# Patient Record
Sex: Female | Born: 1955 | Race: White | Hispanic: No | Smoking: Current every day smoker
Health system: Southern US, Community
[De-identification: ages and names within clinical notes are randomized; demographics above are authoritative.]

## PROBLEM LIST (undated history)

## (undated) DIAGNOSIS — J449 Chronic obstructive pulmonary disease, unspecified: Secondary | ICD-10-CM

## (undated) DIAGNOSIS — I10 Essential (primary) hypertension: Secondary | ICD-10-CM

## (undated) DIAGNOSIS — R87619 Unspecified abnormal cytological findings in specimens from cervix uteri: Secondary | ICD-10-CM

## (undated) DIAGNOSIS — R87629 Unspecified abnormal cytological findings in specimens from vagina: Secondary | ICD-10-CM

## (undated) DIAGNOSIS — N816 Rectocele: Secondary | ICD-10-CM

## (undated) HISTORY — DX: Chronic obstructive pulmonary disease, unspecified: J44.9

## (undated) HISTORY — PX: TUBAL LIGATION: SHX77

## (undated) HISTORY — PX: CHOLECYSTECTOMY: SHX55

## (undated) HISTORY — PX: APPENDECTOMY: SHX54

## (undated) HISTORY — DX: Unspecified abnormal cytological findings in specimens from vagina: R87.629

## (undated) HISTORY — DX: Rectocele: N81.6

## (undated) HISTORY — DX: Essential (primary) hypertension: I10

## (undated) HISTORY — DX: Unspecified abnormal cytological findings in specimens from cervix uteri: R87.619

---

## 2001-11-19 ENCOUNTER — Encounter: Payer: Self-pay | Admitting: Internal Medicine

## 2001-11-19 ENCOUNTER — Ambulatory Visit (HOSPITAL_COMMUNITY): Admission: RE | Admit: 2001-11-19 | Discharge: 2001-11-19 | Payer: Self-pay | Admitting: Internal Medicine

## 2003-10-21 ENCOUNTER — Inpatient Hospital Stay (HOSPITAL_COMMUNITY): Admission: AD | Admit: 2003-10-21 | Discharge: 2003-10-23 | Payer: Self-pay | Admitting: General Surgery

## 2005-09-08 ENCOUNTER — Ambulatory Visit (HOSPITAL_COMMUNITY): Admission: RE | Admit: 2005-09-08 | Discharge: 2005-09-08 | Payer: Self-pay | Admitting: Family Medicine

## 2010-07-24 ENCOUNTER — Encounter: Payer: Self-pay | Admitting: Family Medicine

## 2015-12-24 ENCOUNTER — Other Ambulatory Visit (HOSPITAL_COMMUNITY): Payer: Self-pay | Admitting: Internal Medicine

## 2015-12-24 DIAGNOSIS — Z1231 Encounter for screening mammogram for malignant neoplasm of breast: Secondary | ICD-10-CM

## 2015-12-30 ENCOUNTER — Ambulatory Visit (HOSPITAL_COMMUNITY): Payer: Self-pay

## 2016-01-06 ENCOUNTER — Ambulatory Visit (HOSPITAL_COMMUNITY)
Admission: RE | Admit: 2016-01-06 | Discharge: 2016-01-06 | Disposition: A | Discharge status: Home / Self Care | Payer: BLUE CROSS/BLUE SHIELD | Source: Ambulatory Visit | Attending: Internal Medicine | Admitting: Internal Medicine

## 2016-01-06 ENCOUNTER — Other Ambulatory Visit (HOSPITAL_COMMUNITY)
Admission: RE | Admit: 2016-01-06 | Discharge: 2016-01-06 | Disposition: A | Discharge status: Home / Self Care | Payer: BLUE CROSS/BLUE SHIELD | Source: Ambulatory Visit | Attending: Adult Health | Admitting: Adult Health

## 2016-01-06 ENCOUNTER — Encounter: Payer: Self-pay | Admitting: Adult Health

## 2016-01-06 ENCOUNTER — Ambulatory Visit (INDEPENDENT_AMBULATORY_CARE_PROVIDER_SITE_OTHER): Payer: BLUE CROSS/BLUE SHIELD | Admitting: Adult Health

## 2016-01-06 VITALS — BP 122/70 | HR 100 | Ht 67.0 in | Wt 155.0 lb

## 2016-01-06 DIAGNOSIS — N816 Rectocele: Secondary | ICD-10-CM | POA: Diagnosis not present

## 2016-01-06 DIAGNOSIS — Z1151 Encounter for screening for human papillomavirus (HPV): Secondary | ICD-10-CM | POA: Insufficient documentation

## 2016-01-06 DIAGNOSIS — Z1211 Encounter for screening for malignant neoplasm of colon: Secondary | ICD-10-CM

## 2016-01-06 DIAGNOSIS — Z01411 Encounter for gynecological examination (general) (routine) with abnormal findings: Secondary | ICD-10-CM | POA: Insufficient documentation

## 2016-01-06 DIAGNOSIS — Z1231 Encounter for screening mammogram for malignant neoplasm of breast: Secondary | ICD-10-CM | POA: Diagnosis not present

## 2016-01-06 DIAGNOSIS — Z01419 Encounter for gynecological examination (general) (routine) without abnormal findings: Secondary | ICD-10-CM

## 2016-01-06 HISTORY — DX: Rectocele: N81.6

## 2016-01-06 LAB — HEMOCCULT GUIAC POC 1CARD (OFFICE): FECAL OCCULT BLD: NEGATIVE

## 2016-01-06 NOTE — Patient Instructions (Signed)
Physical in 1 year, pap in 3 if normal Mammogram yearly Colonoscopy advised Labs at work

## 2016-01-06 NOTE — Progress Notes (Signed)
Patient ID: TASA SADD, female   DOB: 01-Jun-1956, 60 y.o.   MRN: [id number] History of Present Illness: Diana Hubbard is a 60 year old white female, married in for a well woman gyn exam and pap, she is new to this practice.She works at [employer] PCP [facility]   Current Medications, Allergies, Past Medical History, Past Surgical History, Family History and Social History were reviewed in [employer] record.     Review of Systems: Patient denies any headaches, hearing loss, fatigue, blurred vision, shortness of breath, chest pain, abdominal pain, problems with bowel movements, urination, or intercourse. No mood swings.She has some aches and pains in hips and low back at times.She denies any hot flashes.    Physical Exam:BP 122/70 mmHg  Pulse 100  Ht 5\' 7"  (1.702 m)  Wt 155 lb (70.308 kg)  BMI 24.27 kg/m2 General:  Well developed, well nourished, no acute distress Skin:  Warm and dry, has increased number AKs and moles,none of concern Neck:  Midline trachea, normal thyroid, good ROM, no lymphadenopathy,no carotid bruits heard Lungs; Clear to auscultation bilaterally Breast:  No dominant palpable mass, retraction, or nipple discharge Cardiovascular: Regular rate and rhythm Abdomen:  Soft, non tender, no hepatosplenomegaly Pelvic:  External genitalia is normal in appearance, has some black heads left groin.  The vagina has decreased color, moisture and rugae. Urethra has no lesions or masses. The cervix is smooth, pap with HPV performed.  Uterus is felt to be normal size, shape, and contour.  No adnexal masses or tenderness noted.Bladder is non tender, no masses felt. Rectal: Good sphincter tone, no polyps, or hemorrhoids felt.  Hemoccult negative.+rectocele Extremities/musculoskeletal:  No swelling or varicosities noted, no clubbing or cyanosis Psych:  No mood changes, alert and cooperative,seems happy   Impression: Well woman gyn exam and pap Rectocele      Plan: Physical in 1 year, pap in 3 if normal Mammogram yearly,she had this [facility] at [facility]  Labs at work Colonoscopy advised

## 2016-01-10 LAB — CYTOLOGY - PAP

## 2016-01-12 ENCOUNTER — Telehealth: Payer: Self-pay | Admitting: Adult Health

## 2016-01-12 ENCOUNTER — Encounter: Payer: Self-pay | Admitting: Adult Health

## 2016-01-12 DIAGNOSIS — R87619 Unspecified abnormal cytological findings in specimens from cervix uteri: Secondary | ICD-10-CM

## 2016-01-12 DIAGNOSIS — R87612 Low grade squamous intraepithelial lesion on cytologic smear of cervix (LGSIL): Secondary | ICD-10-CM

## 2016-01-12 HISTORY — DX: Unspecified abnormal cytological findings in specimens from cervix uteri: R87.619

## 2016-01-12 NOTE — Telephone Encounter (Signed)
Pt aware pap abnormal and needs colpo, appt made

## 2016-01-12 NOTE — Telephone Encounter (Signed)
Left message to call about pap,if she calls needs colpo 

## 2016-01-20 ENCOUNTER — Encounter: Payer: BLUE CROSS/BLUE SHIELD | Admitting: Obstetrics & Gynecology

## 2016-02-02 ENCOUNTER — Encounter: Payer: Self-pay | Admitting: Obstetrics & Gynecology

## 2016-02-02 ENCOUNTER — Ambulatory Visit (INDEPENDENT_AMBULATORY_CARE_PROVIDER_SITE_OTHER): Payer: BLUE CROSS/BLUE SHIELD | Admitting: Obstetrics & Gynecology

## 2016-02-02 VITALS — BP 120/80 | HR 72 | Ht 67.2 in | Wt 152.0 lb

## 2016-02-02 DIAGNOSIS — N871 Moderate cervical dysplasia: Secondary | ICD-10-CM | POA: Diagnosis not present

## 2016-02-02 DIAGNOSIS — R87612 Low grade squamous intraepithelial lesion on cytologic smear of cervix (LGSIL): Secondary | ICD-10-CM | POA: Diagnosis not present

## 2016-02-02 DIAGNOSIS — IMO0002 Reserved for concepts with insufficient information to code with codable children: Secondary | ICD-10-CM

## 2016-02-02 NOTE — Progress Notes (Signed)
Colposcopy Procedure Note:  Colposcopy Procedure Note  Indications: Pap smear 1 months ago showed: low-grade squamous intraepithelial neoplasia (LGSIL - encompassing HPV,mild dysplasia,CIN I). The prior pap showed ASCUS with NEGATIVE high risk HPV.  Prior cervical/vaginal disease: normal exam without visible pathology. Prior cervical treatment: no treatment.  Smoker:  Yes.   New sexual partner:  No.  : time frame:    History of abnormal Pap: yes  Procedure Details  The risks and benefits of the procedure and Written informed consent obtained.  Speculum placed in vagina and excellent visualization of cervix achieved, cervix swabbed x 3 with acetic acid solution.  Findings: Cervix: no visible lesions, no mosaicism, no punctation and no abnormal vasculature; no biopsies taken. Vaginal inspection: normal without visible lesions. Vulvar colposcopy: vulvar colposcopy not performed.  Specimens: none  Complications: none.  Plan: Follow up Pap 1 year, needs 3 years of normal Paps to return to baseline screening

## 2016-02-08 ENCOUNTER — Telehealth: Payer: Self-pay | Admitting: Family Medicine

## 2016-02-08 NOTE — Telephone Encounter (Signed)
LM for patient to call back and set up a new pt apt with Dr. Meda Coffee

## 2016-03-21 DIAGNOSIS — I10 Essential (primary) hypertension: Secondary | ICD-10-CM | POA: Insufficient documentation

## 2016-03-21 DIAGNOSIS — Z720 Tobacco use: Secondary | ICD-10-CM | POA: Insufficient documentation

## 2016-03-22 ENCOUNTER — Ambulatory Visit (INDEPENDENT_AMBULATORY_CARE_PROVIDER_SITE_OTHER): Payer: BLUE CROSS/BLUE SHIELD | Admitting: Family Medicine

## 2016-03-22 ENCOUNTER — Encounter: Payer: Self-pay | Admitting: Family Medicine

## 2016-03-22 VITALS — BP 134/72 | HR 92 | Resp 18 | Ht 68.0 in | Wt 152.1 lb

## 2016-03-22 DIAGNOSIS — Z720 Tobacco use: Secondary | ICD-10-CM

## 2016-03-22 DIAGNOSIS — Z7189 Other specified counseling: Secondary | ICD-10-CM

## 2016-03-22 DIAGNOSIS — J432 Centrilobular emphysema: Secondary | ICD-10-CM | POA: Insufficient documentation

## 2016-03-22 DIAGNOSIS — J449 Chronic obstructive pulmonary disease, unspecified: Secondary | ICD-10-CM

## 2016-03-22 DIAGNOSIS — Z7689 Persons encountering health services in other specified circumstances: Secondary | ICD-10-CM

## 2016-03-22 DIAGNOSIS — I10 Essential (primary) hypertension: Secondary | ICD-10-CM | POA: Diagnosis not present

## 2016-03-22 DIAGNOSIS — Z1211 Encounter for screening for malignant neoplasm of colon: Secondary | ICD-10-CM

## 2016-03-22 DIAGNOSIS — R0989 Other specified symptoms and signs involving the circulatory and respiratory systems: Secondary | ICD-10-CM | POA: Diagnosis not present

## 2016-03-22 MED ORDER — LOSARTAN POTASSIUM-HCTZ 50-12.5 MG PO TABS: 1.0000 | ORAL_TABLET | Freq: Every day | ORAL | 3 refills | Status: DC

## 2016-03-22 NOTE — Patient Instructions (Addendum)
Need records and labs from prior providers  Walk every day that you are able  Stop the lisinopril and HCTZ Take losartan and hydrochlorothiazide once a day in its place Let me know if you have any side effect or problem  Come back in a month  I have ordered an ultrasound of your neck to check on the bruit

## 2016-03-22 NOTE — Progress Notes (Signed)
Chief Complaint  Patient presents with  . Establish Care    seen once w/ Dr. Maudie Mercury and was previously being seen at [facility] at work    60 year old woman who is new to establish. She has previously cared for by a number of providers. Her most recent medical care has been [facility]. These records are requested. No old records are available today. Only records involve her recent GYN visit, abnormal Pap smear and colposcopy. She has had a recent mammogram. Health maintenance standpoint she states her shots are up-to-date, however she has not had a shingles shot, she has not had a Prevnar, and she can't remember her last tetanus shot. She thinks this information will be in her old records. Does get yearly flu shots. She has never had a colonoscopy. She has refused today. We discussed the importance of colon cancer screening. She does agree today to a cologuard. She agrees that if her cologuard is positive then she will have a colonoscopy. She states that she was identified as having hypertension about 8 months ago. She has been on hydrochlorothiazide and lisinopril. She feels like her blood pressure has been under good control. Unfortunately. The lisinopril is causing a cough. I'm going to discontinue her ACE inhibitor and put her on an ARB instead. She will come back in a month for [place] to check her blood pressure again. She's been smoking since she was a teenager. She smokes a half to three quarters of a pack a day. She has not had all interested in smoking cessation, or smoking counseling. She hasn't really been diagnosed with COPD. I have discussed the multiple health risks associated with cigarette smoking including, but not limited to, cardiovascular disease, lung disease and cancer.  I have strongly recommended that smoking be stopped.  I have reviewed the various methods of quitting including cold [place], classes, nicotine replacements and prescription  medications.  I have offered assistance in this difficult process.  The patient is not interested in assistance at this time. The patient sees a Designer, jewellery at the health department. The nurse practitioner told her she has a carotid bruit. She is interested in having this worked up. She has a family history of vascular disease. I told her with hypertension, a family history of vascular disease, and cigarette smoking that this may be an indication of hardening of the arteries. This would entail both a cardiac workup as well as an ultrasound of her neck. He states she has never been told she has any diabetes or high cholesterol. The patient's tries to eat a well-balanced diet. She does state "I like my red meat". She does not get regular exercise.      Patient Active Problem List   Diagnosis Date Noted  . COPD (chronic obstructive pulmonary disease) ([place]) 03/22/2016  . Essential hypertension 03/21/2016  . Tobacco abuse 03/21/2016  . Abnormal Pap smear of cervix 01/12/2016  . Rectocele 01/06/2016    Outpatient Encounter Prescriptions as of 03/22/2016  Medication Sig  . hydrochlorothiazide (HYDRODIURIL) 25 MG tablet Take 25 mg by mouth daily.  . [DISCONTINUED] lisinopril (PRINIVIL,ZESTRIL) 20 MG tablet Take 20 mg by mouth daily.  Marland Kitchen losartan-hydrochlorothiazide (HYZAAR) 50-12.5 MG tablet Take 1 tablet by mouth daily.  . [DISCONTINUED] penicillin v potassium (VEETID) 500 MG tablet Take 500 mg by mouth 4 (four) times daily.   No facility-administered encounter medications on file as of 03/22/2016.     Past Medical History:  Diagnosis Date  . Abnormal Pap smear of cervix 01/12/2016   LSIL will get colpo  . COPD (chronic obstructive pulmonary disease) ([place])   . Hypertension   . Rectocele 01/06/2016  . Vaginal Pap smear, abnormal     Past Surgical History:  Procedure Laterality Date  . APPENDECTOMY    . CHOLECYSTECTOMY    . TUBAL LIGATION      Social History   Social History  .  Marital status: Married    Spouse name: Elta Guadeloupe  . Number of children: 2  . 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 Smoking status: Current Every Day Smoker    Packs/day: 0.75    Years: 40.00    Types: Cigarettes  . Smokeless tobacco: Never Used  . Alcohol use Yes     Comment: social  . Drug use: No  . Sexual activity: Yes    Birth control/ protection: Post-menopausal, Surgical     Comment: tubal   Other Topics Concern  . Not on file   Social History Narrative   Married.  Lives with Husband   Limited caffeine up to 2 -3 cups a day   No reg exercise, walk at work    Family History  Problem Relation Age of Onset  . Diabetes Mother   . Hypertension Mother   . Hyperlipidemia Mother   . Other Father     abd aneursym  . Hyperlipidemia Father   . Hypertension Father   . Hemophilia Father   . Cancer Maternal Grandmother     uterine    Review of Systems  Constitutional: Negative for chills, fever and weight loss.  HENT: Negative for congestion and hearing loss.   Eyes: Negative for blurred vision and pain.  Respiratory: Positive for cough. Negative for sputum production and shortness of breath.   Cardiovascular: Negative for chest pain and leg swelling.  Gastrointestinal: Negative for abdominal pain, constipation, diarrhea and heartburn.  Genitourinary: Negative for dysuria and frequency.  Musculoskeletal: Negative for falls, joint pain and myalgias.  Neurological: Negative for dizziness, seizures and headaches.  Psychiatric/Behavioral: Negative for depression. The patient is not nervous/anxious and does not have insomnia.    BP 134/72   Pulse 92   Resp 18   Ht 5\' 8"  (1.727 m)   Wt 152 lb 1.3 oz (69 kg)   SpO2 98%   BMI 23.12 kg/m   Physical Exam  Constitutional: She is oriented to person, place, and time. She appears well-developed and well-nourished.  HENT:  Head: Normocephalic and  atraumatic.  Right Ear: External ear normal.  Left Ear: External ear normal.  Mouth/Throat: Oropharynx is clear and moist.  Eyes: Conjunctivae are normal. Pupils are equal, round, and reactive to light.  Neck: Normal range of motion. Neck supple. No thyromegaly present.  Cardiovascular: Normal rate, regular rhythm, normal heart sounds and intact distal pulses.   No murmur heard. Bilateral short carotid bruit  Pulmonary/Chest: Effort normal and breath sounds normal. No respiratory distress.  Abdominal: Soft. Bowel sounds are normal.  Musculoskeletal: Normal range of motion. She exhibits no edema.  Lymphadenopathy:    She has no cervical adenopathy.  Neurological: She is alert and oriented to person, place, and time. She has normal reflexes.  Gait normal  Skin: Skin is warm and dry.  Psychiatric: She has a normal mood and affect. Her behavior is normal. Thought content normal.  Nursing  note and vitals reviewed.  ASSESSMENT/PLAN:   1. Essential hypertension. Stop lisinopril   stop hydrochlorothiazide Take losartan/hydrochlorothiazide combination pill once a day. Follow blood pressure. Goal blood pressure of under 140/90 is discussed  2. Tobacco abuse  discussed  3. Chronic obstructive pulmonary disease, unspecified COPD type ([place]) Patient denies symptoms  4. Bilateral carotid bruits - US Carotid Bilateral; Future  5. Encounter to establish care with new doctor   6. Screen for colon cancer  - Cologuard   Patient Instructions  Need records and labs from prior providers  Walk every day that you are able  Stop the lisinopril and HCTZ Take losartan and hydrochlorothiazide once a day in its place Let me know if you have any side effect or problem  Come back in a month  I have ordered an ultrasound of your neck to check on the bruit   Discuss my recommendation that she get a shingles shot and Prevnar shot. She states she can get these through the health Department.    60 minutes is spent with this patient. More than 50% in counseling regarding the importance of tobacco cessation, and the consequences of continuing to smoke especially with physical exam findings suggestive of atherosclerotic cerebrovascular disease. We discussed preventative medicine, colonoscopy, cologuard testing. Discussed healthy lifestyle, diet, and exercise recommendations.  Raylene Everts, MD

## 2016-04-17 ENCOUNTER — Other Ambulatory Visit: Payer: Self-pay | Admitting: Family Medicine

## 2016-04-20 ENCOUNTER — Encounter: Payer: Self-pay | Admitting: Family Medicine

## 2016-04-20 ENCOUNTER — Ambulatory Visit (INDEPENDENT_AMBULATORY_CARE_PROVIDER_SITE_OTHER): Payer: BLUE CROSS/BLUE SHIELD | Admitting: Family Medicine

## 2016-04-20 VITALS — BP 124/78 | HR 88 | Temp 98.6°F | Resp 18 | Ht 68.0 in | Wt 152.1 lb

## 2016-04-20 DIAGNOSIS — I10 Essential (primary) hypertension: Secondary | ICD-10-CM

## 2016-04-20 DIAGNOSIS — Z720 Tobacco use: Secondary | ICD-10-CM | POA: Diagnosis not present

## 2016-04-20 DIAGNOSIS — J441 Chronic obstructive pulmonary disease with (acute) exacerbation: Secondary | ICD-10-CM | POA: Diagnosis not present

## 2016-04-20 NOTE — Progress Notes (Signed)
    Chief Complaint  Patient presents with  . Hypertension   Here for follow up Changed BP medicine and feels well. No more cough Needs note to get immunizations t health dept - provided Still has not yet had ultrasound neck - re ordered Still smoking.  No wanting to listen to discussion about wuitting   Patient Active Problem List   Diagnosis Date Noted  . COPD (chronic obstructive pulmonary disease) ([place]) 03/22/2016  . Essential hypertension 03/21/2016  . Tobacco abuse 03/21/2016  . Abnormal Pap smear of cervix 01/12/2016  . Rectocele 01/06/2016    Outpatient Encounter Prescriptions as of 04/20/2016  Medication Sig  . hydrochlorothiazide (HYDRODIURIL) 25 MG tablet Take 25 mg by mouth daily.  Marland Kitchen losartan-hydrochlorothiazide (HYZAAR) 50-12.5 MG tablet Take 1 tablet by mouth daily.   No facility-administered encounter medications on file as of 04/20/2016.     Allergies  Allergen Reactions  . Ace Inhibitors Cough    cough    Review of Systems  Constitutional: Negative for activity change, appetite change and unexpected weight change.  HENT: Negative for congestion, postnasal drip and rhinorrhea.   Eyes: Negative for redness and visual disturbance.  Respiratory: Negative for cough and shortness of breath.   Cardiovascular: Negative for chest pain, palpitations and leg swelling.  Gastrointestinal: Negative for blood in stool, constipation and diarrhea.  Genitourinary: Negative for difficulty urinating.  Musculoskeletal: Negative for arthralgias and back pain.  Neurological: Negative for light-headedness and headaches.  Psychiatric/Behavioral: Negative for dysphoric mood. The patient is not nervous/anxious.     BP 124/78 (BP Location: Left Arm, Patient Position: Sitting, Cuff Size: Normal)   Pulse 88   Temp 98.6 F (37 C) (Oral)   Resp 18   Ht 5\' 8"  (1.727 m)   Wt 152 lb 1.3 oz (69 kg)   SpO2 99%   BMI 23.12 kg/m   Physical Exam  Constitutional: She is oriented  to person, place, and time. She appears well-developed and well-nourished.  HENT:  Head: Normocephalic and atraumatic.  Mouth/Throat: Oropharynx is clear and moist.  Eyes: Conjunctivae are normal. Pupils are equal, round, and reactive to light.  Neck: Normal range of motion. Neck supple. No thyromegaly present.  Cardiovascular: Normal rate, regular rhythm and intact distal pulses.   Murmur heard. Systolic murmur, Bilateral short carotid bruit  Pulmonary/Chest: Effort normal and breath sounds normal. No respiratory distress.  Musculoskeletal: Normal range of motion. She exhibits no edema.  Lymphadenopathy:    She has no cervical adenopathy.  Neurological: She is alert and oriented to person, place, and time.  Gait normal  Psychiatric: She has a normal mood and affect. Her behavior is normal. Thought content normal.  Nursing note and vitals reviewed.   ASSESSMENT/PLAN:  1. Essential hypertension controlled  - CBC - Comprehensive metabolic panel - Lipid panel - Urine culture - VITAMIN D 25 Hydroxy (Vit-D Deficiency, Fractures)  2. COPD exacerbation (HCC) No symptoms  3. Tobacco abuse  Advised to quit   Patient Instructions  Need ultrasound neck See me in six months   Raylene Everts, MD

## 2016-04-20 NOTE — Patient Instructions (Signed)
Need ultrasound neck See me in six months

## 2016-04-25 ENCOUNTER — Ambulatory Visit (HOSPITAL_COMMUNITY): Payer: BLUE CROSS/BLUE SHIELD

## 2017-03-08 ENCOUNTER — Ambulatory Visit (INDEPENDENT_AMBULATORY_CARE_PROVIDER_SITE_OTHER): Payer: Commercial Managed Care - PPO | Admitting: Family Medicine

## 2017-03-08 ENCOUNTER — Encounter: Payer: Self-pay | Admitting: Family Medicine

## 2017-03-08 VITALS — BP 166/90 | HR 104 | Temp 97.6°F | Resp 18 | Ht 68.0 in | Wt 137.0 lb

## 2017-03-08 DIAGNOSIS — Z720 Tobacco use: Secondary | ICD-10-CM

## 2017-03-08 DIAGNOSIS — R0989 Other specified symptoms and signs involving the circulatory and respiratory systems: Secondary | ICD-10-CM

## 2017-03-08 DIAGNOSIS — L98499 Non-pressure chronic ulcer of skin of other sites with unspecified severity: Secondary | ICD-10-CM

## 2017-03-08 DIAGNOSIS — I771 Stricture of artery: Secondary | ICD-10-CM

## 2017-03-08 LAB — CBC WITH DIFFERENTIAL/PLATELET
BASOS ABS: 39 {cells}/uL (ref 0–200)
Basophils Relative: 0.4 %
EOS ABS: 49 {cells}/uL (ref 15–500)
EOS PCT: 0.5 %
HEMATOCRIT: 39.2 % (ref 35.0–45.0)
HEMOGLOBIN: 13.3 g/dL (ref 11.7–15.5)
LYMPHS ABS: 2153 {cells}/uL (ref 850–3900)
MCH: 34.2 pg — AB (ref 27.0–33.0)
MCHC: 33.9 g/dL (ref 32.0–36.0)
MCV: 100.8 fL — ABNORMAL HIGH (ref 80.0–100.0)
MPV: 9 fL (ref 7.5–12.5)
Monocytes Relative: 5.6 %
NEUTROS ABS: 6916 {cells}/uL (ref 1500–7800)
Neutrophils Relative %: 71.3 %
Platelets: 272 10*3/uL (ref 140–400)
RBC: 3.89 10*6/uL (ref 3.80–5.10)
RDW: 11.9 % (ref 11.0–15.0)
Total Lymphocyte: 22.2 %
WBC mixed population: 543 cells/uL (ref 200–950)
WBC: 9.7 10*3/uL (ref 3.8–10.8)

## 2017-03-08 LAB — COMPLETE METABOLIC PANEL WITH GFR
AG Ratio: 1.7 (calc) (ref 1.0–2.5)
ALT: 17 U/L (ref 6–29)
AST: 23 U/L (ref 10–35)
Albumin: 4.6 g/dL (ref 3.6–5.1)
Alkaline phosphatase (APISO): 73 U/L (ref 33–130)
BUN/Creatinine Ratio: 16 (calc) (ref 6–22)
BUN: 27 mg/dL — AB (ref 7–25)
CALCIUM: 9.5 mg/dL (ref 8.6–10.4)
CO2: 22 mmol/L (ref 20–32)
Chloride: 98 mmol/L (ref 98–110)
Creat: 1.69 mg/dL — ABNORMAL HIGH (ref 0.50–0.99)
GFR, EST AFRICAN AMERICAN: 37 mL/min/{1.73_m2} — AB (ref >=60)
GFR, EST NON AFRICAN AMERICAN: 32 mL/min/{1.73_m2} — AB (ref >=60)
GLUCOSE: 94 mg/dL (ref 65–139)
Globulin: 2.7 g/dL (calc) (ref 1.9–3.7)
Potassium: 4.3 mmol/L (ref 3.5–5.3)
Sodium: 131 mmol/L — ABNORMAL LOW (ref 135–146)
TOTAL PROTEIN: 7.3 g/dL (ref 6.1–8.1)
Total Bilirubin: 0.6 mg/dL (ref 0.2–1.2)

## 2017-03-08 LAB — LIPID PANEL
Cholesterol: 180 mg/dL (ref <200)
HDL: 55 mg/dL (ref >50)
LDL Cholesterol (Calc): 79 mg/dL (calc)
NON-HDL CHOLESTEROL (CALC): 125 mg/dL (ref <130)
Total CHOL/HDL Ratio: 3.3 (calc) (ref <5.0)
Triglycerides: 384 mg/dL — ABNORMAL HIGH (ref <150)

## 2017-03-08 MED ORDER — LOSARTAN POTASSIUM-HCTZ 50-12.5 MG PO TABS: 1.0000 | ORAL_TABLET | Freq: Every day | ORAL | 3 refills | Status: DC

## 2017-03-08 NOTE — Patient Instructions (Signed)
Lab tests TODAY You MUST go to a vascular specialist for consult Clean and change dressing every 5-7 days

## 2017-03-08 NOTE — Progress Notes (Signed)
    Chief Complaint  Patient presents with  . Wound Infection    right ankle since 08/2016   wound for 6 months, getting larger Not responding to routine wound car Took 10 d of septra DS Used a steroid cream for a while  Is noncompliant with carotid artery ultrasound Has not gotten labs Still smokes    Patient Active Problem List   Diagnosis Date Noted  . COPD (chronic obstructive pulmonary disease) ([place]) 03/22/2016  . Essential hypertension 03/21/2016  . Tobacco abuse 03/21/2016  . Abnormal Pap smear of cervix 01/12/2016  . Rectocele 01/06/2016    Outpatient Encounter Prescriptions as of 03/08/2017  Medication Sig  . losartan-hydrochlorothiazide (HYZAAR) 50-12.5 MG tablet Take 1 tablet by mouth daily.  . [DISCONTINUED] losartan-hydrochlorothiazide (HYZAAR) 50-12.5 MG tablet Take 1 tablet by mouth daily.  . [DISCONTINUED] hydrochlorothiazide (HYDRODIURIL) 25 MG tablet Take 25 mg by mouth daily.   No facility-administered encounter medications on file as of 03/08/2017.     Allergies  Allergen Reactions  . Ace Inhibitors Cough    cough   Review of Systems  Constitutional: Negative for chills and fever.  HENT: Negative for congestion and dental problem.   Eyes: Negative for photophobia and visual disturbance.  Respiratory: Negative for cough and shortness of breath.   Cardiovascular: Positive for leg swelling. Negative for chest pain and palpitations.       Ankle swells L  Skin: Positive for wound.       Painful wound L  Neurological: Negative for dizziness and facial asymmetry.  All other systems reviewed and are negative.  BP (!) 166/90   Pulse (!) 104   Temp 97.6 F (36.4 C) (Temporal)   Resp 18   Ht 5\' 8"  (1.727 m)   Wt 137 lb 0.6 oz (62.2 kg)   SpO2 100%   BMI 20.84 kg/m   Physical Exam  Constitutional: She appears well-developed and well-nourished.  HENT:  Head: Normocephalic and atraumatic.  Mouth/Throat: Oropharynx is clear and moist.  Eyes: Pupils  are equal, round, and reactive to light. Conjunctivae are normal.  Neck:  bilat bruit  Cardiovascular: Regular rhythm and normal heart sounds.   Tachycardia.  NO PEDAL PULSES  Pulmonary/Chest: Effort normal and breath sounds normal.  clear  Skin: Skin is warm and dry.  Left ankle with painful irregular border , 2 cm across, full thickness ulcer to fat    ASSESSMENT/PLAN:  1. Arterial insufficiency with ischemic ulcer (HCC) - Lipid panel - COMPLETE METABOLIC PANEL WITH GFR - CBC with Differential/Platelet - Ambulatory referral to Vascular Surgery  2. Bilateral carotid bruits  3. Tobacco abuse  Had a discussion about the serious nature of her vascular disease and the absolute need to get this worked up and cared for.   Patient Instructions  Lab tests TODAY You MUST go to a vascular specialist for consult Clean and change dressing every 5-7 days    Raylene Everts, MD

## 2017-03-12 ENCOUNTER — Other Ambulatory Visit: Payer: Self-pay

## 2017-03-12 DIAGNOSIS — L98499 Non-pressure chronic ulcer of skin of other sites with unspecified severity: Secondary | ICD-10-CM

## 2017-03-13 ENCOUNTER — Inpatient Hospital Stay (HOSPITAL_COMMUNITY): Admission: RE | Admit: 2017-03-13 | Payer: Commercial Managed Care - PPO | Source: Ambulatory Visit

## 2017-03-13 ENCOUNTER — Encounter: Payer: Commercial Managed Care - PPO | Admitting: Vascular Surgery

## 2017-03-15 ENCOUNTER — Encounter (HOSPITAL_COMMUNITY): Payer: Commercial Managed Care - PPO

## 2017-03-16 ENCOUNTER — Encounter: Payer: Commercial Managed Care - PPO | Admitting: Vascular Surgery

## 2017-04-09 ENCOUNTER — Ambulatory Visit (INDEPENDENT_AMBULATORY_CARE_PROVIDER_SITE_OTHER): Payer: Commercial Managed Care - PPO | Admitting: Vascular Surgery

## 2017-04-09 ENCOUNTER — Encounter (HOSPITAL_COMMUNITY): Payer: Commercial Managed Care - PPO

## 2017-04-09 ENCOUNTER — Encounter: Payer: Self-pay | Admitting: Vascular Surgery

## 2017-04-09 ENCOUNTER — Encounter: Payer: Commercial Managed Care - PPO | Admitting: Vascular Surgery

## 2017-04-09 ENCOUNTER — Ambulatory Visit (HOSPITAL_COMMUNITY)
Admission: RE | Admit: 2017-04-09 | Discharge: 2017-04-09 | Disposition: A | Discharge status: Home / Self Care | Payer: Commercial Managed Care - PPO | Source: Ambulatory Visit | Attending: Vascular Surgery | Admitting: Vascular Surgery

## 2017-04-09 VITALS — BP 145/79 | HR 106 | Temp 97.7°F | Resp 16 | Ht 68.0 in | Wt 137.0 lb

## 2017-04-09 DIAGNOSIS — I83013 Varicose veins of right lower extremity with ulcer of ankle: Secondary | ICD-10-CM | POA: Diagnosis not present

## 2017-04-09 DIAGNOSIS — I872 Venous insufficiency (chronic) (peripheral): Secondary | ICD-10-CM | POA: Insufficient documentation

## 2017-04-09 DIAGNOSIS — L98499 Non-pressure chronic ulcer of skin of other sites with unspecified severity: Secondary | ICD-10-CM | POA: Diagnosis not present

## 2017-04-09 DIAGNOSIS — L97312 Non-pressure chronic ulcer of right ankle with fat layer exposed: Secondary | ICD-10-CM | POA: Diagnosis not present

## 2017-04-09 MED ORDER — COLLAGENASE 250 UNIT/GM EX OINT: TOPICAL_OINTMENT | CUTANEOUS | 0 refills | Status: DC

## 2017-04-09 NOTE — Progress Notes (Signed)
Requested by:  Diana Everts, MD [phone], [place] [address]  Reason for consultation: non-healing R ankle wond    History of Present Illness   Diana Hubbard is a 61 y.o. (07/11/1955) female who presents with chief complaint: non-healing R ankle wound.  Patient notes, development of small wound in Feb 2018 associated without obvious etiology.  The patient notes some swelling in both legs, more in foot, chronically.  This wound has waxed and waned in size.  Pt has been on multiple abx and seen a Dermatologist during the these months.    The patient has had no history of DVT, known history of pregnancy, known history of varicose vein, possible history of venous stasis ulcers, no history of  Lymphedema and known history of skin changes in lower legs.  There is known family history of venous disorders.  The patient has never used compression stockings in the past.  Past Medical History:  Diagnosis Date  . Abnormal Pap smear of cervix 01/12/2016   LSIL will get colpo  . COPD (chronic obstructive pulmonary disease) ([place])   . Hypertension   . Rectocele 01/06/2016  . Vaginal Pap smear, abnormal     Past Surgical History:  Procedure Laterality Date  . APPENDECTOMY    . CHOLECYSTECTOMY    . TUBAL LIGATION      Social History   Social History  . Marital status: Married    Spouse name: Diana Hubbard  . Number of children: 2  . 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 Smoking status: Current Every Day Smoker    Packs/day: 0.75    Years: 40.00    Types: Cigarettes  . Smokeless tobacco: Never Used  . Alcohol use Yes     Comment: social  . Drug use: No  . Sexual activity: Yes    Birth control/ protection: Post-menopausal, Surgical     Comment: tubal   Other Topics Concern  . Not on file   Social History Narrative   Married.  Lives with Husband   Limited caffeine up to 2 -3 cups  a day   No reg exercise, walk at work    Family History  Problem Relation Age of Onset  . Diabetes Mother   . Hypertension Mother   . Hyperlipidemia Mother   . Other Father        abd aneursym  . Hyperlipidemia Father   . Hypertension Father   . Hemophilia Father   . Cancer Maternal Grandmother        uterine    Current Outpatient Prescriptions  Medication Sig Dispense Refill  . losartan-hydrochlorothiazide (HYZAAR) 50-12.5 MG tablet Take 1 tablet by mouth daily. 90 tablet 3  . collagenase (SANTYL) ointment Apply to Right ankle daily.  Wet to dry dressing on top. 30 g 0   No current facility-administered medications for this visit.     Allergies  Allergen Reactions  . Ace Inhibitors Cough    cough    REVIEW OF SYSTEMS (negative unless checked):   Cardiac:  []  Chest pain or chest pressure? []  Shortness of breath upon activity? []  Shortness of breath when lying flat? []  Irregular heart rhythm?  Vascular:  []  Pain in calf, thigh, or hip brought on by walking? []  Pain in feet at night that wakes you up from your sleep? []  Blood clot in your  veins? [x]  Leg swelling?  Pulmonary:  []  Oxygen at home? []  Productive cough? []  Wheezing?  Neurologic:  []  Sudden weakness in arms or legs? []  Sudden numbness in arms or legs? []  Sudden onset of difficult speaking or slurred speech? []  Temporary loss of vision in one eye? []  Problems with dizziness?  Gastrointestinal:  []  Blood in stool? []  Vomited blood?  Genitourinary:  []  Burning when urinating? []  Blood in urine?  Psychiatric:  []  Major depression  Hematologic:  []  Bleeding problems? []  Problems with blood clotting?  Dermatologic:  [x]  Rashes or ulcers?  Constitutional:  []  Fever or chills?  Ear/Nose/Throat:  []  Change in hearing? []  Nose bleeds? []  Sore throat?  Musculoskeletal:  []  Back pain? []  Joint pain? []  Muscle pain?   Physical Examination     Vitals:   04/09/17 1338 04/09/17  1342  BP: (!) 171/86 (!) 145/79  Pulse: (!) 105 (!) 106  Resp: 16   Temp: 97.7 F (36.5 C)   SpO2: 99%   Weight: 137 lb (62.1 kg)   Height: 5\' 8"  (1.727 m)    Body mass index is 20.83 kg/m.  General Alert, O x 3, WD, NAD  Head [place]/AT,    Ear/Nose/ Throat Hearing grossly intact, nares without erythema or drainage, oropharynx without Erythema or Exudate, Mallampati score: 3,   Eyes PERRLA, EOMI,    Neck Supple, mid-line trachea,    Pulmonary Sym exp, good B air movt, CTA B  Cardiac RRR, Nl S1, S2, no Murmurs, No rubs, No S3,S4  Vascular Vessel Right Left  Radial Palpable Palpable  Brachial Palpable Palpable  Carotid Palpable, No Bruit Palpable, No Bruit  Aorta Not palpable N/A  Femoral Palpable Palpable  Popliteal Not palpable Not palpable  PT Palpable Palpable  DP Palpable Palpable    Gastro- intestinal soft, non-distended, non-tender to palpation, No guarding or rebound, no HSM, no masses, no CVAT B, No palpable prominent aortic pulse,    Musculo- skeletal M/S 5/5 throughout  , Extremities without ischemic changes , R medial malleolus shallow ulcer with some debris Non-pitting edema present: 1+ B, Varicosities present: B small-moderate, Lipodermatosclerosis present: L>R  Neurologic Cranial nerves 2-12 intact , Pain and light touch intact in extremities , Motor exam as listed above  Psychiatric Judgement intact, Mood & affect appropriate for pt's clinical situation  Dermatologic See M/S exam for extremity exam, No rashes otherwise noted  Lymphatic  Palpable lymph nodes: None    Non-invasive Vascular Imaging   ABI (04/09/2017)  R:   ABI: 1.04,   PT: bi  DP: tri  TBI:  0.43  L:   ABI: 1.10,   PT: tri  DP: tri  TBI: 0.73   Medical Decision Making   Diana Hubbard is a 61 y.o. 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  Based on the patient's history and examination, I recommend: Santyl ointment to R ankle ulcer with wet-to-dry dressing using hydrogel daily.  Referral to [facility] for further wound mgmt and compression therapy.  After patient is healed, will bring her back in 3 months for B venous reflux duplex and determination if any intervention needed to prevent recurrence.  Thank you for allowing [place] to participate in this patient's care.   Adele Barthel, MD, FACS [facility] [place] Office: [phone] Pager: [phone]  04/09/2017, 2:07 PM

## 2017-04-13 NOTE — Addendum Note (Signed)
Addended by: Lianne Cure A on: 04/13/2017 09:58 AM   Modules accepted: Orders

## 2017-05-14 ENCOUNTER — Encounter (HOSPITAL_BASED_OUTPATIENT_CLINIC_OR_DEPARTMENT_OTHER): Payer: Commercial Managed Care - PPO

## 2017-07-13 ENCOUNTER — Encounter (HOSPITAL_COMMUNITY): Payer: Commercial Managed Care - PPO

## 2017-07-13 ENCOUNTER — Ambulatory Visit: Payer: Commercial Managed Care - PPO | Admitting: Vascular Surgery

## 2017-08-10 ENCOUNTER — Encounter (HOSPITAL_BASED_OUTPATIENT_CLINIC_OR_DEPARTMENT_OTHER): Payer: Commercial Managed Care - PPO | Attending: Internal Medicine

## 2017-08-10 DIAGNOSIS — I87331 Chronic venous hypertension (idiopathic) with ulcer and inflammation of right lower extremity: Secondary | ICD-10-CM | POA: Diagnosis present

## 2017-08-10 DIAGNOSIS — L97311 Non-pressure chronic ulcer of right ankle limited to breakdown of skin: Secondary | ICD-10-CM | POA: Diagnosis not present

## 2017-08-10 DIAGNOSIS — J449 Chronic obstructive pulmonary disease, unspecified: Secondary | ICD-10-CM | POA: Diagnosis not present

## 2017-08-10 DIAGNOSIS — F17210 Nicotine dependence, cigarettes, uncomplicated: Secondary | ICD-10-CM | POA: Diagnosis not present

## 2017-08-10 DIAGNOSIS — I10 Essential (primary) hypertension: Secondary | ICD-10-CM | POA: Insufficient documentation

## 2017-08-17 DIAGNOSIS — I87331 Chronic venous hypertension (idiopathic) with ulcer and inflammation of right lower extremity: Secondary | ICD-10-CM | POA: Diagnosis not present

## 2017-08-27 DIAGNOSIS — I87331 Chronic venous hypertension (idiopathic) with ulcer and inflammation of right lower extremity: Secondary | ICD-10-CM | POA: Diagnosis not present

## 2017-08-27 NOTE — Progress Notes (Signed)
Established Venous Insufficiency   History of Present Illness   Diana Hubbard is a 62 y.o. (1956/01/17) female who presents with chief complaint: non-healed R ankle ulcer.  The right ankle ulcer reported got infected and the patient took a few rounds of antibiotics.  The patient's symptoms are: yellow crusting of R ankle ulcer. Patient is now in wound care at [facility].  The patient is compliant with knee high compression stockings.  The patient's PMH, PSH, SH, and FamHx are unchanged from 04/09/17.  Current Outpatient Medications  Medication Sig Dispense Refill  . collagenase (SANTYL) ointment Apply to Right ankle daily.  Wet to dry dressing on top. 30 g 0  . losartan-hydrochlorothiazide (HYZAAR) 50-12.5 MG tablet Take 1 tablet by mouth daily. 90 tablet 3   No current facility-administered medications for this visit.     Allergies  Allergen Reactions  . Ace Inhibitors Cough    cough    On ROS today: continued R ankle ulcer, no drainage, no fever or chills   Physical Examination   Vitals:   08/29/17 1606  BP: (!) 152/82  Pulse: 89  Resp: 18  Temp: 98.3 F (36.8 C)  TempSrc: Oral  SpO2: 96%  Weight: 139 lb 14.4 oz (63.5 kg)  Height: 5\' 8"  (1.727 m)   Body mass index is 21.27 kg/m.  General Alert, O x 3, WD, NAD  Pulmonary Sym exp, good B air movt, CTA B  Cardiac RRR, Nl S1, S2, no Murmurs, No rubs, No S3,S4  Vascular Vessel Right Left  Radial Palpable Palpable  Brachial Palpable Palpable  Carotid Palpable, No Bruit Palpable, No Bruit  Aorta Not palpable N/A  Femoral Palpable Palpable  Popliteal Not palpable Not palpable  PT Palpable Palpable  DP Palpable Palpable    Gastro- intestinal soft, non-distended, non-tender to palpation, No guarding or rebound, no HSM, no masses, no CVAT B, No palpable prominent aortic pulse,    Musculo- skeletal M/S 5/5 throughout  , Extremities without ischemic changes  , Non-pitting edema present: 1+ B, Varicosities present: B,  No Lipodermatosclerosis present, shallow VSU in R medial ankle: minimal serous drainage, no pus, no smell  Neurologic Pain and light touch intact in extremities , Motor exam as listed above     Non-Invasive Vascular Imaging   BLE Venous Insufficiency Duplex (08/29/2017)  Venous Reflux Times Normal value < 0.5 sec  Right (ms) Left (ms)  CFV 1.3 3520.0  FV  3645.0  GSV at groin 2163.0 3535.4  GSV prox thigh 990.2   GSV mid thigh 2581.9   GSV at knee 594.1   SSV at level of sapheno-popliteal junction 3125.0 6125.0  Vein Diameters:  Right (mm) Left (mm)  GSV at SFJ 0.46  .58   GSV at prox thigh 0.39  .41   GSV at mid thigh 0.29  .32   GSV at distal thigh 0.36  .36   GSV at knee 0.28  .39   GSV prox calf 0.31  .31   GSV mid calf 0.27  .24   SSV .80  .63      Final Interpretation: Right: Abnormal reflux times were noted in the common femoral vein, great saphenous vein at the groin, great saphenous vein at the proximal thigh, great saphenous vein at the mid thigh, great saphenous vein at the knee, and small saphenous vein at the distal thigh extending to the mid calf area. There is no evidence of deep vein thrombosis in the lower extremity. 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   Left: Abnormal reflux times were noted in the common femoral vein, femoral vein in the thigh, great saphenous vein at the groin, and small saphenous vein at the distal thigh extending to the mid calf area. There is no evidence of deep vein thrombosis in the lower extremity.     Medical Decision Making   Diana Hubbard is a 62 y.o. female who presents with: BLE chronic venous insufficiency (C6 Ep As,d Pr), varicose veins with complications, chronic R VSU   Pt is getting debridement at the [facility].  She will get compression at that center after the wound is cleaned up.  After 3 months of compression, will have patient returned  for re-evaluation for R GSV EVLA to prevent recurrent VSU.  Thank you for allowing [place] to participate in this patient's care.   Adele Barthel, MD, FACS [facility] [place] Office: [phone] Pager: [phone]

## 2017-08-29 ENCOUNTER — Ambulatory Visit (INDEPENDENT_AMBULATORY_CARE_PROVIDER_SITE_OTHER): Payer: Commercial Managed Care - PPO | Admitting: Vascular Surgery

## 2017-08-29 ENCOUNTER — Ambulatory Visit (HOSPITAL_COMMUNITY)
Admission: RE | Admit: 2017-08-29 | Discharge: 2017-08-29 | Disposition: A | Discharge status: Home / Self Care | Payer: Commercial Managed Care - PPO | Source: Ambulatory Visit | Attending: Vascular Surgery | Admitting: Vascular Surgery

## 2017-08-29 ENCOUNTER — Encounter: Payer: Self-pay | Admitting: Vascular Surgery

## 2017-08-29 VITALS — BP 152/82 | HR 89 | Temp 98.3°F | Resp 18 | Ht 68.0 in | Wt 139.9 lb

## 2017-08-29 DIAGNOSIS — L97312 Non-pressure chronic ulcer of right ankle with fat layer exposed: Secondary | ICD-10-CM | POA: Diagnosis not present

## 2017-08-29 DIAGNOSIS — I83013 Varicose veins of right lower extremity with ulcer of ankle: Secondary | ICD-10-CM

## 2017-08-29 DIAGNOSIS — I872 Venous insufficiency (chronic) (peripheral): Secondary | ICD-10-CM | POA: Diagnosis present

## 2017-09-03 ENCOUNTER — Encounter (HOSPITAL_BASED_OUTPATIENT_CLINIC_OR_DEPARTMENT_OTHER): Payer: Commercial Managed Care - PPO | Attending: Internal Medicine

## 2017-09-03 DIAGNOSIS — J449 Chronic obstructive pulmonary disease, unspecified: Secondary | ICD-10-CM | POA: Diagnosis not present

## 2017-09-03 DIAGNOSIS — I87331 Chronic venous hypertension (idiopathic) with ulcer and inflammation of right lower extremity: Secondary | ICD-10-CM | POA: Insufficient documentation

## 2017-09-03 DIAGNOSIS — F17210 Nicotine dependence, cigarettes, uncomplicated: Secondary | ICD-10-CM | POA: Insufficient documentation

## 2017-09-03 DIAGNOSIS — L97312 Non-pressure chronic ulcer of right ankle with fat layer exposed: Secondary | ICD-10-CM | POA: Diagnosis not present

## 2017-09-03 DIAGNOSIS — I10 Essential (primary) hypertension: Secondary | ICD-10-CM | POA: Diagnosis not present

## 2017-09-10 ENCOUNTER — Encounter: Payer: Self-pay | Admitting: Family Medicine

## 2017-09-10 DIAGNOSIS — I87331 Chronic venous hypertension (idiopathic) with ulcer and inflammation of right lower extremity: Secondary | ICD-10-CM | POA: Diagnosis not present

## 2017-09-17 DIAGNOSIS — I87331 Chronic venous hypertension (idiopathic) with ulcer and inflammation of right lower extremity: Secondary | ICD-10-CM | POA: Diagnosis not present

## 2017-09-25 DIAGNOSIS — I87331 Chronic venous hypertension (idiopathic) with ulcer and inflammation of right lower extremity: Secondary | ICD-10-CM | POA: Diagnosis not present

## 2017-10-01 ENCOUNTER — Encounter (HOSPITAL_BASED_OUTPATIENT_CLINIC_OR_DEPARTMENT_OTHER): Payer: Commercial Managed Care - PPO | Attending: Internal Medicine

## 2017-10-01 DIAGNOSIS — L97312 Non-pressure chronic ulcer of right ankle with fat layer exposed: Secondary | ICD-10-CM | POA: Insufficient documentation

## 2017-10-01 DIAGNOSIS — I87331 Chronic venous hypertension (idiopathic) with ulcer and inflammation of right lower extremity: Secondary | ICD-10-CM | POA: Diagnosis not present

## 2017-10-08 DIAGNOSIS — I87331 Chronic venous hypertension (idiopathic) with ulcer and inflammation of right lower extremity: Secondary | ICD-10-CM | POA: Diagnosis not present

## 2017-10-22 DIAGNOSIS — I87331 Chronic venous hypertension (idiopathic) with ulcer and inflammation of right lower extremity: Secondary | ICD-10-CM | POA: Diagnosis not present

## 2017-10-29 DIAGNOSIS — I87331 Chronic venous hypertension (idiopathic) with ulcer and inflammation of right lower extremity: Secondary | ICD-10-CM | POA: Diagnosis not present

## 2017-11-05 ENCOUNTER — Encounter (HOSPITAL_BASED_OUTPATIENT_CLINIC_OR_DEPARTMENT_OTHER): Payer: Commercial Managed Care - PPO | Attending: Internal Medicine

## 2017-11-05 DIAGNOSIS — J449 Chronic obstructive pulmonary disease, unspecified: Secondary | ICD-10-CM | POA: Insufficient documentation

## 2017-11-05 DIAGNOSIS — I87331 Chronic venous hypertension (idiopathic) with ulcer and inflammation of right lower extremity: Secondary | ICD-10-CM | POA: Insufficient documentation

## 2017-11-05 DIAGNOSIS — L97312 Non-pressure chronic ulcer of right ankle with fat layer exposed: Secondary | ICD-10-CM | POA: Insufficient documentation

## 2017-11-05 DIAGNOSIS — I10 Essential (primary) hypertension: Secondary | ICD-10-CM | POA: Insufficient documentation

## 2017-11-12 DIAGNOSIS — J449 Chronic obstructive pulmonary disease, unspecified: Secondary | ICD-10-CM | POA: Diagnosis not present

## 2017-11-12 DIAGNOSIS — I10 Essential (primary) hypertension: Secondary | ICD-10-CM | POA: Diagnosis not present

## 2017-11-12 DIAGNOSIS — I87331 Chronic venous hypertension (idiopathic) with ulcer and inflammation of right lower extremity: Secondary | ICD-10-CM | POA: Diagnosis not present

## 2017-11-12 DIAGNOSIS — L97312 Non-pressure chronic ulcer of right ankle with fat layer exposed: Secondary | ICD-10-CM | POA: Diagnosis not present

## 2017-11-19 DIAGNOSIS — I87331 Chronic venous hypertension (idiopathic) with ulcer and inflammation of right lower extremity: Secondary | ICD-10-CM | POA: Diagnosis not present

## 2017-11-27 ENCOUNTER — Other Ambulatory Visit: Payer: Self-pay

## 2017-11-27 ENCOUNTER — Encounter: Payer: Self-pay | Admitting: Vascular Surgery

## 2017-11-27 ENCOUNTER — Ambulatory Visit (INDEPENDENT_AMBULATORY_CARE_PROVIDER_SITE_OTHER): Payer: Commercial Managed Care - PPO | Admitting: Vascular Surgery

## 2017-11-27 VITALS — BP 162/84 | HR 122 | Temp 97.0°F | Resp 16 | Ht 67.5 in | Wt 136.0 lb

## 2017-11-27 DIAGNOSIS — I83013 Varicose veins of right lower extremity with ulcer of ankle: Secondary | ICD-10-CM | POA: Diagnosis not present

## 2017-11-27 DIAGNOSIS — L97312 Non-pressure chronic ulcer of right ankle with fat layer exposed: Secondary | ICD-10-CM

## 2017-11-27 NOTE — Progress Notes (Signed)
[facility] [place]  Patient name: Diana Hubbard MRN: [id number] DOB: 06-09-1956 Sex: female  REASON FOR VISIT: Continued follow-up of venous ulceration right leg  HPI: Diana Hubbard is a 62 y.o. female here for follow-up.  Has had severe venous ulceration which is been very difficult to treat.  She has had multiple different treatment attempts prior to the wound center and compression.  Despite this she is still not making any progress.  She has a large 5 cm open ulcer on the medial aspect of her right ankle.  She has significant pain associated with this as well.  Past Medical History:  Diagnosis Date  . Abnormal Pap smear of cervix 01/12/2016   LSIL will get colpo  . COPD (chronic obstructive pulmonary disease) ([place])   . Hypertension   . Rectocele 01/06/2016  . Vaginal Pap smear, abnormal     Family History  Problem Relation Age of Onset  . Diabetes Mother   . Hypertension Mother   . Hyperlipidemia Mother   . Other Father        abd aneursym  . Hyperlipidemia Father   . Hypertension Father   . Hemophilia Father   . Cancer Maternal Grandmother        uterine    SOCIAL HISTORY: Social History   Tobacco Use  . Smoking status: Current Every Day Smoker    Packs/day: 0.75    Years: 40.00    Pack years: 30.00    Types: Cigarettes  . Smokeless tobacco: Never Used  Substance Use Topics  . Alcohol use: Yes    Comment: social    Allergies  Allergen Reactions  . Ace Inhibitors Cough    cough    Current Outpatient Medications  Medication Sig Dispense Refill  . collagenase (SANTYL) ointment Apply to Right ankle daily.  Wet to dry dressing on top. 30 g 0  . losartan-hydrochlorothiazide (HYZAAR) 50-12.5 MG tablet Take 1 tablet by mouth daily. 90 tablet 3   No current facility-administered medications for this visit.     REVIEW OF SYSTEMS:  [X]  denotes positive finding, [ ]  denotes negative  finding Cardiac  Comments:  Chest pain or chest pressure:    Shortness of breath upon exertion:    Short of breath when lying flat:    Irregular heart rhythm:        Vascular    Pain in calf, thigh, or hip brought on by ambulation:    Pain in feet at night that wakes you up from your sleep:     Blood clot in your veins:    Leg swelling:           PHYSICAL EXAM: Vitals:   11/27/17 1551 11/27/17 1553  BP: (!) 163/86 (!) 162/84  Pulse: (!) 122   Resp: 16   Temp: (!) 97 F (36.1 C)   SpO2: 97%   Weight: 136 lb (61.7 kg)   Height: 5' 7.5" (1.715 m)     GENERAL: The patient is a well-nourished female, in no acute distress. The vital signs are documented above. CARDIOVASCULAR: 2+ dorsalis pedis pulses bilaterally. PULMONARY: There is good air exchange  MUSCULOSKELETAL: There are no major deformities or cyanosis. NEUROLOGIC: No focal weakness or paresthesias are detected. SKIN: Open ulcer with some fibrinous exudate at this ankle. PSYCHIATRIC: The patient has a normal affect.  DATA:  Reviewed her duplex.  She does have reflux in the great saphenous vein on the right.  I  reimage this and as was seen in the duplex this vein is relatively small throughout its course.  She has marked dilatation of her small saphenous vein from the popliteal junction all the way down to the distal calf.  MEDICAL ISSUES: CEAP 6 Severe venous stasis to with ulcer that is been present for 1 year.  Recommended small saphenous vein ablation for improvement in her venous hypertension.  Plan the procedure and slight risk of DVT associated with this.  She wished to proceed as soon as possible    Rosetta Posner, MD [facility] Tel [phone] Pager [phone]

## 2017-11-29 DIAGNOSIS — I87331 Chronic venous hypertension (idiopathic) with ulcer and inflammation of right lower extremity: Secondary | ICD-10-CM | POA: Diagnosis not present

## 2017-12-05 ENCOUNTER — Other Ambulatory Visit: Payer: Self-pay | Admitting: *Deleted

## 2017-12-05 DIAGNOSIS — L97312 Non-pressure chronic ulcer of right ankle with fat layer exposed: Principal | ICD-10-CM

## 2017-12-05 DIAGNOSIS — I83013 Varicose veins of right lower extremity with ulcer of ankle: Secondary | ICD-10-CM

## 2017-12-10 ENCOUNTER — Encounter (HOSPITAL_BASED_OUTPATIENT_CLINIC_OR_DEPARTMENT_OTHER): Payer: Commercial Managed Care - PPO | Attending: Internal Medicine

## 2017-12-10 DIAGNOSIS — L97311 Non-pressure chronic ulcer of right ankle limited to breakdown of skin: Secondary | ICD-10-CM | POA: Insufficient documentation

## 2017-12-10 DIAGNOSIS — I87331 Chronic venous hypertension (idiopathic) with ulcer and inflammation of right lower extremity: Secondary | ICD-10-CM | POA: Insufficient documentation

## 2017-12-10 DIAGNOSIS — L03115 Cellulitis of right lower limb: Secondary | ICD-10-CM | POA: Insufficient documentation

## 2017-12-10 DIAGNOSIS — I10 Essential (primary) hypertension: Secondary | ICD-10-CM | POA: Insufficient documentation

## 2017-12-10 DIAGNOSIS — J449 Chronic obstructive pulmonary disease, unspecified: Secondary | ICD-10-CM | POA: Insufficient documentation

## 2017-12-11 ENCOUNTER — Telehealth: Payer: Self-pay | Admitting: Vascular Surgery

## 2017-12-11 NOTE — Telephone Encounter (Signed)
-----   Message from Rolla Flatten, RN sent at 12/11/2017  8:52 AM EDT ----- We need to cancel he fu appts and move them up to 6/27 from 7/25. Thx Plz call pt with the new fu times. Thx

## 2017-12-11 NOTE — Telephone Encounter (Signed)
sch appt lvm 12/27/17 9am lab 10am MD

## 2017-12-17 DIAGNOSIS — I87331 Chronic venous hypertension (idiopathic) with ulcer and inflammation of right lower extremity: Secondary | ICD-10-CM | POA: Diagnosis not present

## 2017-12-20 ENCOUNTER — Ambulatory Visit (INDEPENDENT_AMBULATORY_CARE_PROVIDER_SITE_OTHER): Payer: Commercial Managed Care - PPO | Admitting: Vascular Surgery

## 2017-12-20 ENCOUNTER — Encounter: Payer: Self-pay | Admitting: Vascular Surgery

## 2017-12-20 VITALS — BP 149/85 | HR 110 | Temp 97.6°F | Resp 16 | Ht 67.5 in | Wt 136.0 lb

## 2017-12-20 DIAGNOSIS — I83013 Varicose veins of right lower extremity with ulcer of ankle: Secondary | ICD-10-CM

## 2017-12-20 DIAGNOSIS — L97312 Non-pressure chronic ulcer of right ankle with fat layer exposed: Secondary | ICD-10-CM

## 2017-12-20 DIAGNOSIS — I872 Venous insufficiency (chronic) (peripheral): Secondary | ICD-10-CM | POA: Diagnosis not present

## 2017-12-20 NOTE — Progress Notes (Signed)
     Laser Ablation Procedure    Date: 12/20/2017   Diana Hubbard DOB:[birth year]  Consent signed: Yes    Surgeon:  Dr. Sherren Mocha Tyeasha Ebbs  Procedure: Laser Ablation: right Small Saphenous Vein  BP (!) 149/85   Pulse (!) 110   Temp 97.6 F (36.4 C)   Resp 16   Ht 5' 7.5" (1.715 m)   Wt 136 lb (61.7 kg)   SpO2 99%   BMI 20.99 kg/m   Tumescent Anesthesia: 280 cc 0.9% NaCl with 50 cc Lidocaine HCL with 1% Epi and 15 cc 8.4% NaHCO3  Local Anesthesia: 3 cc Lidocaine HCL and NaHCO3 (ratio 2:1)  15 watts continuous mode        Total energy: 1453   Total time: 1:37    Patient tolerated procedure well  Notes:   Description of Procedure:  After marking the course of the secondary varicosities, the patient was placed on the operating table in the prone position, and the right leg was prepped and draped in sterile fashion.   Local anesthetic was administered and under ultrasound guidance the saphenous vein was accessed with a micro needle and guide wire; then the mirco puncture sheath was placed.  A guide wire was inserted saphenopopliteal junction , followed by a 5 french sheath.  The position of the sheath and then the laser fiber below the junction was confirmed using the ultrasound.  Tumescent anesthesia was administered along the course of the saphenous vein using ultrasound guidance. The patient was placed in Trendelenburg position and protective laser glasses were placed on patient and staff, and the laser was fired at 15 watts continuous mode advancing 1-42mm/second for a total of 1453 joules.     Steri strips were applied to the stab wounds and ABD pads and thigh high compression stockings were applied.  Ace wrap bandages were applied over the phlebectomy sites and at the top of the saphenopopliteal junction. Blood loss was less than 15 cc.  The patient ambulated out of the operating room having tolerated the procedure well.  Uneventful ablation of small saphenous vein from distal  calf to just below saphenous popliteal junction.  Follow-up in 1 week

## 2017-12-21 ENCOUNTER — Encounter: Payer: Self-pay | Admitting: Vascular Surgery

## 2017-12-27 ENCOUNTER — Ambulatory Visit (HOSPITAL_COMMUNITY)
Admission: RE | Admit: 2017-12-27 | Discharge: 2017-12-27 | Disposition: A | Discharge status: Home / Self Care | Payer: Commercial Managed Care - PPO | Source: Ambulatory Visit | Attending: Vascular Surgery | Admitting: Vascular Surgery

## 2017-12-27 ENCOUNTER — Ambulatory Visit (INDEPENDENT_AMBULATORY_CARE_PROVIDER_SITE_OTHER): Payer: Commercial Managed Care - PPO | Admitting: Vascular Surgery

## 2017-12-27 ENCOUNTER — Encounter: Payer: Self-pay | Admitting: Vascular Surgery

## 2017-12-27 ENCOUNTER — Other Ambulatory Visit: Payer: Self-pay

## 2017-12-27 VITALS — BP 163/92 | HR 91 | Resp 18 | Ht 67.5 in | Wt 137.5 lb

## 2017-12-27 DIAGNOSIS — L97312 Non-pressure chronic ulcer of right ankle with fat layer exposed: Secondary | ICD-10-CM | POA: Diagnosis present

## 2017-12-27 DIAGNOSIS — Z9889 Other specified postprocedural states: Secondary | ICD-10-CM | POA: Diagnosis not present

## 2017-12-27 DIAGNOSIS — I83013 Varicose veins of right lower extremity with ulcer of ankle: Secondary | ICD-10-CM | POA: Diagnosis not present

## 2017-12-27 NOTE — Progress Notes (Signed)
[facility] [place]  Patient name: Diana Hubbard MRN: [id number] DOB: January 29, 1956 Sex: female  REASON FOR VISIT: Week follow-up laser ablation small saphenous vein  HPI: Diana Hubbard is a 62 y.o. female today for follow-up.  She had minimal discomfort associated with this and has been compliant with her compression garment  Past Medical History:  Diagnosis Date  . Abnormal Pap smear of cervix 01/12/2016   LSIL will get colpo  . COPD (chronic obstructive pulmonary disease) ([place])   . Hypertension   . Rectocele 01/06/2016  . Vaginal Pap smear, abnormal     Family History  Problem Relation Age of Onset  . Diabetes Mother   . Hypertension Mother   . Hyperlipidemia Mother   . Other Father        abd aneursym  . Hyperlipidemia Father   . Hypertension Father   . Hemophilia Father   . Cancer Maternal Grandmother        uterine    SOCIAL HISTORY: Social History   Tobacco Use  . Smoking status: Current Every Day Smoker    Packs/day: 0.75    Years: 40.00    Pack years: 30.00    Types: Cigarettes  . Smokeless tobacco: Never Used  Substance Use Topics  . Alcohol use: Yes    Comment: social    Allergies  Allergen Reactions  . Ace Inhibitors Cough    cough    Current Outpatient Medications  Medication Sig Dispense Refill  . losartan-hydrochlorothiazide (HYZAAR) 50-12.5 MG tablet Take 1 tablet by mouth daily. 90 tablet 3  . collagenase (SANTYL) ointment Apply to Right ankle daily.  Wet to dry dressing on top. (Patient not taking: Reported on 12/20/2017) 30 g 0   No current facility-administered medications for this visit.     REVIEW OF SYSTEMS:  [X]  denotes positive finding, [ ]  denotes negative finding Cardiac  Comments:  Chest pain or chest pressure:    Shortness of breath upon exertion:    Short of breath when lying flat:    Irregular heart rhythm:        Vascular    Pain in calf, thigh, or hip  brought on by ambulation:    Pain in feet at night that wakes you up from your sleep:     Blood clot in your veins:    Leg swelling:  x         PHYSICAL EXAM: Vitals:   12/27/17 0956  BP: (!) 163/92  Pulse: 91  Resp: 18  SpO2: 100%  Weight: 137 lb 8 oz (62.4 kg)  Height: 5' 7.5" (1.715 m)    GENERAL: The patient is a well-nourished female, in no acute distress. The vital signs are documented above. CARDIOVASCULAR: Palpable right dorsalis pedis pulse.  Erythema in the posterior calf PULMONARY: There is good air exchange  MUSCULOSKELETAL: There are no major deformities or cyanosis. NEUROLOGIC: No focal weakness or paresthesias are detected. SKIN: There are no ulcers or rashes noted. PSYCHIATRIC: The patient has a normal affect.  DATA:  Duplex reveals closure of her small saphenous vein from the distal insertion site in her calf to the level of the saphenous popliteal junction.  No evidence of DVT  MEDICAL ISSUES: Stable postop 1 week from laser ablation.  We will continue her follow-up with wound center and compression until her wound is completely healed.  We will see [place] again on an as-needed basis    Rosetta Posner, MD  FACS [facility] Tel [phone] Pager [phone]

## 2017-12-31 ENCOUNTER — Encounter (HOSPITAL_BASED_OUTPATIENT_CLINIC_OR_DEPARTMENT_OTHER): Payer: Commercial Managed Care - PPO | Attending: Internal Medicine

## 2017-12-31 DIAGNOSIS — I10 Essential (primary) hypertension: Secondary | ICD-10-CM | POA: Insufficient documentation

## 2017-12-31 DIAGNOSIS — J449 Chronic obstructive pulmonary disease, unspecified: Secondary | ICD-10-CM | POA: Diagnosis not present

## 2017-12-31 DIAGNOSIS — I87331 Chronic venous hypertension (idiopathic) with ulcer and inflammation of right lower extremity: Secondary | ICD-10-CM | POA: Diagnosis present

## 2017-12-31 DIAGNOSIS — L97312 Non-pressure chronic ulcer of right ankle with fat layer exposed: Secondary | ICD-10-CM | POA: Diagnosis not present

## 2017-12-31 DIAGNOSIS — F17210 Nicotine dependence, cigarettes, uncomplicated: Secondary | ICD-10-CM | POA: Diagnosis not present

## 2018-01-10 ENCOUNTER — Other Ambulatory Visit: Payer: Commercial Managed Care - PPO | Admitting: Vascular Surgery

## 2018-01-14 DIAGNOSIS — I87331 Chronic venous hypertension (idiopathic) with ulcer and inflammation of right lower extremity: Secondary | ICD-10-CM | POA: Diagnosis not present

## 2018-01-17 ENCOUNTER — Ambulatory Visit: Payer: Commercial Managed Care - PPO | Admitting: Vascular Surgery

## 2018-01-17 ENCOUNTER — Other Ambulatory Visit: Payer: Commercial Managed Care - PPO | Admitting: Vascular Surgery

## 2018-01-17 ENCOUNTER — Encounter (HOSPITAL_COMMUNITY): Payer: Commercial Managed Care - PPO

## 2018-01-21 DIAGNOSIS — I87331 Chronic venous hypertension (idiopathic) with ulcer and inflammation of right lower extremity: Secondary | ICD-10-CM | POA: Diagnosis not present

## 2018-01-24 ENCOUNTER — Ambulatory Visit: Payer: Commercial Managed Care - PPO | Admitting: Vascular Surgery

## 2018-01-24 ENCOUNTER — Encounter (HOSPITAL_COMMUNITY): Payer: Commercial Managed Care - PPO

## 2018-02-08 ENCOUNTER — Encounter (HOSPITAL_BASED_OUTPATIENT_CLINIC_OR_DEPARTMENT_OTHER): Payer: Commercial Managed Care - PPO | Attending: Internal Medicine

## 2018-02-08 DIAGNOSIS — I87331 Chronic venous hypertension (idiopathic) with ulcer and inflammation of right lower extremity: Secondary | ICD-10-CM | POA: Insufficient documentation

## 2018-02-08 DIAGNOSIS — J449 Chronic obstructive pulmonary disease, unspecified: Secondary | ICD-10-CM | POA: Insufficient documentation

## 2018-02-08 DIAGNOSIS — L97311 Non-pressure chronic ulcer of right ankle limited to breakdown of skin: Secondary | ICD-10-CM | POA: Diagnosis not present

## 2018-02-08 DIAGNOSIS — F17210 Nicotine dependence, cigarettes, uncomplicated: Secondary | ICD-10-CM | POA: Insufficient documentation

## 2018-02-08 DIAGNOSIS — I10 Essential (primary) hypertension: Secondary | ICD-10-CM | POA: Insufficient documentation

## 2018-02-18 DIAGNOSIS — I87331 Chronic venous hypertension (idiopathic) with ulcer and inflammation of right lower extremity: Secondary | ICD-10-CM | POA: Diagnosis not present

## 2018-02-25 DIAGNOSIS — I87331 Chronic venous hypertension (idiopathic) with ulcer and inflammation of right lower extremity: Secondary | ICD-10-CM | POA: Diagnosis not present

## 2018-03-11 ENCOUNTER — Encounter (HOSPITAL_BASED_OUTPATIENT_CLINIC_OR_DEPARTMENT_OTHER): Payer: Commercial Managed Care - PPO | Attending: Internal Medicine

## 2018-03-11 DIAGNOSIS — I87331 Chronic venous hypertension (idiopathic) with ulcer and inflammation of right lower extremity: Secondary | ICD-10-CM | POA: Diagnosis present

## 2018-03-11 DIAGNOSIS — L97312 Non-pressure chronic ulcer of right ankle with fat layer exposed: Secondary | ICD-10-CM | POA: Diagnosis not present

## 2018-03-11 DIAGNOSIS — I10 Essential (primary) hypertension: Secondary | ICD-10-CM | POA: Diagnosis not present

## 2018-03-11 DIAGNOSIS — J449 Chronic obstructive pulmonary disease, unspecified: Secondary | ICD-10-CM | POA: Insufficient documentation

## 2018-03-25 DIAGNOSIS — I87331 Chronic venous hypertension (idiopathic) with ulcer and inflammation of right lower extremity: Secondary | ICD-10-CM | POA: Diagnosis not present

## 2018-04-08 ENCOUNTER — Encounter (HOSPITAL_BASED_OUTPATIENT_CLINIC_OR_DEPARTMENT_OTHER): Payer: Commercial Managed Care - PPO | Attending: Internal Medicine

## 2018-04-08 ENCOUNTER — Encounter (HOSPITAL_BASED_OUTPATIENT_CLINIC_OR_DEPARTMENT_OTHER): Payer: Self-pay

## 2018-04-08 DIAGNOSIS — L97312 Non-pressure chronic ulcer of right ankle with fat layer exposed: Secondary | ICD-10-CM | POA: Diagnosis not present

## 2018-04-08 DIAGNOSIS — I10 Essential (primary) hypertension: Secondary | ICD-10-CM | POA: Diagnosis not present

## 2018-04-08 DIAGNOSIS — J449 Chronic obstructive pulmonary disease, unspecified: Secondary | ICD-10-CM | POA: Diagnosis not present

## 2018-04-08 DIAGNOSIS — I87331 Chronic venous hypertension (idiopathic) with ulcer and inflammation of right lower extremity: Secondary | ICD-10-CM | POA: Insufficient documentation

## 2018-04-22 DIAGNOSIS — I87331 Chronic venous hypertension (idiopathic) with ulcer and inflammation of right lower extremity: Secondary | ICD-10-CM | POA: Diagnosis not present

## 2018-05-06 ENCOUNTER — Encounter (HOSPITAL_BASED_OUTPATIENT_CLINIC_OR_DEPARTMENT_OTHER): Payer: Commercial Managed Care - PPO | Attending: Internal Medicine

## 2018-05-06 DIAGNOSIS — I10 Essential (primary) hypertension: Secondary | ICD-10-CM | POA: Insufficient documentation

## 2018-05-06 DIAGNOSIS — J449 Chronic obstructive pulmonary disease, unspecified: Secondary | ICD-10-CM | POA: Insufficient documentation

## 2018-05-06 DIAGNOSIS — I87331 Chronic venous hypertension (idiopathic) with ulcer and inflammation of right lower extremity: Secondary | ICD-10-CM | POA: Diagnosis not present

## 2018-05-06 DIAGNOSIS — L97312 Non-pressure chronic ulcer of right ankle with fat layer exposed: Secondary | ICD-10-CM | POA: Insufficient documentation

## 2018-05-20 DIAGNOSIS — I87331 Chronic venous hypertension (idiopathic) with ulcer and inflammation of right lower extremity: Secondary | ICD-10-CM | POA: Diagnosis not present

## 2018-06-10 ENCOUNTER — Encounter (HOSPITAL_BASED_OUTPATIENT_CLINIC_OR_DEPARTMENT_OTHER): Payer: Commercial Managed Care - PPO | Attending: Internal Medicine

## 2018-06-10 DIAGNOSIS — I10 Essential (primary) hypertension: Secondary | ICD-10-CM | POA: Diagnosis not present

## 2018-06-10 DIAGNOSIS — J449 Chronic obstructive pulmonary disease, unspecified: Secondary | ICD-10-CM | POA: Insufficient documentation

## 2018-06-10 DIAGNOSIS — Z872 Personal history of diseases of the skin and subcutaneous tissue: Secondary | ICD-10-CM | POA: Insufficient documentation

## 2018-06-10 DIAGNOSIS — Z09 Encounter for follow-up examination after completed treatment for conditions other than malignant neoplasm: Secondary | ICD-10-CM | POA: Diagnosis not present

## 2018-12-18 LAB — HEMOGLOBIN A1C: Hemoglobin A1C: 5.2

## 2018-12-18 LAB — CBC AND DIFFERENTIAL
HCT: 41 (ref 36–46)
Hemoglobin: 13.3 (ref 12.0–16.0)
WBC: 11.9

## 2018-12-18 LAB — LIPID PANEL
Cholesterol: 183 (ref 0–200)
HDL: 51 (ref 35–70)
LDL Cholesterol: 92
Triglycerides: 202 — AB (ref 40–160)

## 2018-12-18 LAB — BASIC METABOLIC PANEL
BUN: 16 (ref 4–21)
Creatinine: 1.1 (ref <=1.1)
Glucose: 79
Potassium: 4.5 (ref 3.4–5.3)
Sodium: 134 — AB (ref 137–147)

## 2018-12-18 LAB — TSH: TSH: 2.35 (ref 0.41–5.90)

## 2019-05-01 ENCOUNTER — Ambulatory Visit (INDEPENDENT_AMBULATORY_CARE_PROVIDER_SITE_OTHER): Payer: Commercial Managed Care - PPO | Admitting: Family Medicine

## 2019-05-01 ENCOUNTER — Encounter: Payer: Self-pay | Admitting: Family Medicine

## 2019-05-01 ENCOUNTER — Other Ambulatory Visit: Payer: Self-pay

## 2019-05-01 VITALS — BP 128/80 | HR 98 | Temp 98.2°F | Ht 68.0 in | Wt 140.4 lb

## 2019-05-01 DIAGNOSIS — Z1211 Encounter for screening for malignant neoplasm of colon: Secondary | ICD-10-CM

## 2019-05-01 DIAGNOSIS — I10 Essential (primary) hypertension: Secondary | ICD-10-CM

## 2019-05-01 DIAGNOSIS — Z1231 Encounter for screening mammogram for malignant neoplasm of breast: Secondary | ICD-10-CM

## 2019-05-01 DIAGNOSIS — Z720 Tobacco use: Secondary | ICD-10-CM

## 2019-05-01 MED ORDER — LOSARTAN POTASSIUM-HCTZ 50-12.5 MG PO TABS: 1.0000 | ORAL_TABLET | Freq: Every day | ORAL | 1 refills | Status: DC

## 2019-05-01 NOTE — Progress Notes (Signed)
New Patient Office Visit  Subjective:  Patient ID: Diana Hubbard, female    DOB: [birth year]  Age: 63 y.o. MRN: [id number]  CC:  Chief Complaint  Patient presents with  . Establish Care  HTN-no current concern, long term diagnosis and treatment  HPI Diana Hubbard presents for HTN-Hyzaar daily Ablation -left lower leg-venous insufficiency-ulcer due to PVD  Past Medical History:  Diagnosis Date  . Abnormal Pap smear of cervix 01/12/2016   LSIL will get colpo  . COPD (chronic obstructive pulmonary disease) ([place])   . Hypertension   . Rectocele 01/06/2016  . Vaginal Pap smear, abnormal     Past Surgical History:  Procedure Laterality Date  . APPENDECTOMY    . CHOLECYSTECTOMY    . TUBAL LIGATION      Family History  Problem Relation Age of Onset  . Diabetes Mother   . Hypertension Mother   . Hyperlipidemia Mother   . Other Father        abd aneursym  . Hyperlipidemia Father   . Hypertension Father   . Hemophilia Father   . Cancer Maternal Grandmother        uterine    Social History   Socioeconomic History  . Marital status: Married    Spouse name: Elta Guadeloupe  . Number of children: 2  . Years of education: 18  . Highest education level: Not on file  Occupational History  . 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 Financial resource strain: Not on file  . Food insecurity    Worry: Not on file    Inability: Not on file  . Transportation needs    Medical: Not on file    Non-medical: Not on file  Tobacco Use  . Smoking status: Current Every Day Smoker    Packs/day: 0.75    Years: 40.00    Pack years: 30.00    Types: Cigarettes  . Smokeless tobacco: Never Used  Substance and Sexual Activity  . Alcohol use: Yes    Comment: social  . Drug use: No  . Sexual activity: Yes    Birth control/protection: Post-menopausal, Surgical    Comment: tubal  Lifestyle  . Physical activity    Days per week: Not on file     Minutes per session: Not on file  . Stress: Not on file  Relationships  . Social Herbalist on phone: Not on file    Gets together: Not on file    Attends religious service: Not on file    Active member of club or organization: Not on file    Attends meetings of clubs or organizations: Not on file    Relationship status: Not on file  . Intimate partner violence    Fear of current or ex partner: Not on file    Emotionally abused: Not on file    Physically abused: Not on file    Forced sexual activity: Not on file  Other Topics Concern  . Not on file  Social History Narrative   Married.  Lives with Husband   Limited caffeine up to 2 -3 cups a day   No reg exercise, walk at work    ROS Review of Systems  Constitutional: Negative.   HENT: Negative.   Eyes: Negative.   Respiratory: Negative.   Cardiovascular: Positive for leg swelling.  Gastrointestinal: Negative.   Skin:       pvd  bilat le  Allergic/Immunologic: Negative.   Neurological: Negative.   Psychiatric/Behavioral: Negative.     Objective:   Today's Vitals: BP 128/80 (BP Location: Left Arm, Patient Position: Sitting, Cuff Size: Normal)   Pulse 98   Temp 98.2 F (36.8 C) (Oral)   Ht 5\' 8"  (1.727 m)   Wt 140 lb 6.4 oz (63.7 kg)   SpO2 99%   BMI 21.35 kg/m   Physical Exam Constitutional:      Appearance: Normal appearance. She is obese.  HENT:     Head: Normocephalic and atraumatic.     Right Ear: Tympanic membrane, ear canal and external ear normal.     Left Ear: Tympanic membrane, ear canal and external ear normal.     Nose: Nose normal.     Mouth/Throat:     Mouth: Mucous membranes are moist.  Eyes:     Conjunctiva/sclera: Conjunctivae normal.  Neck:     Musculoskeletal: Normal range of motion.  Cardiovascular:     Rate and Rhythm: Normal rate and regular rhythm.     Pulses: Normal pulses.     Heart sounds: Normal heart sounds.  Pulmonary:     Effort: Pulmonary effort is normal.      Breath sounds: Normal breath sounds.  Skin:    Comments: Varicosities bilat LE  Neurological:     Mental Status: She is alert and oriented to person, place, and time.  Psychiatric:        Mood and Affect: Mood normal.        Behavior: Behavior normal.     Assessment & Plan:  1. Essential hypertension Losartan/HCTZ-rx, labwork fasting reviewed from worksite clinic 2. Encounter for screening mammogram for malignant neoplasm of breast - MM Digital Screening; Future  3. Tobacco use This patient meets the criteria for low dose CT lung cancer screening-30 pack year smoker 63yo. She is asymptomatic for any signs or symptoms of lung cancer. Shared Decision-Making Visit discussion included risks and benefits of screening, potential for follow up, diagnostic testing for abnormal scans and discussion about total radiation exposure. patient stated willingness to undergo diagnostics and treatment as needed. She was counseled on smoking cessation-pt is not ready to quit - CT CHEST LUNG CA SCREEN LOW DOSE W/O CM; Future  4. Screening for colon cancer - Cologuard recommended  Outpatient Encounter Medications as of 05/01/2019  Medication Sig  . collagenase (SANTYL) ointment Apply to Right ankle daily.  Wet to dry dressing on top. (Patient not taking: Reported on 12/20/2017)  . losartan-hydrochlorothiazide (HYZAAR) 50-12.5 MG tablet Take 1 tablet by mouth daily.   No facility-administered encounter medications on file as of 05/01/2019.     Follow-up: 6 months  LISA Hannah Beat, MD

## 2019-05-01 NOTE — Patient Instructions (Addendum)
Colon screen Breast cancer screen Lung cancer screen

## 2019-06-02 ENCOUNTER — Ambulatory Visit (HOSPITAL_COMMUNITY): Payer: Commercial Managed Care - PPO

## 2019-07-15 ENCOUNTER — Ambulatory Visit (HOSPITAL_COMMUNITY): Payer: Commercial Managed Care - PPO

## 2019-09-16 ENCOUNTER — Ambulatory Visit (HOSPITAL_COMMUNITY)
Admission: RE | Admit: 2019-09-16 | Discharge: 2019-09-16 | Disposition: A | Discharge status: Home / Self Care | Payer: Commercial Managed Care - PPO | Source: Ambulatory Visit | Attending: Family Medicine | Admitting: Family Medicine

## 2019-09-16 ENCOUNTER — Other Ambulatory Visit: Payer: Self-pay

## 2019-09-16 DIAGNOSIS — Z720 Tobacco use: Secondary | ICD-10-CM

## 2019-09-17 ENCOUNTER — Telehealth: Payer: Self-pay | Admitting: Emergency Medicine

## 2019-09-17 NOTE — Progress Notes (Signed)
CT shows no current concerns-radiology recommends yearly CT to evaluate for changes in the lungs and aortia

## 2019-09-17 NOTE — Telephone Encounter (Signed)
Was unable to reach patient by phone. Will try again later. When patient do call back please inform patient of Dr Marlowe Kays msg below  please let pt know we need to complete CT lung yearly to monitor lungs and a vascular concern in the aorta. No concerns this year

## 2019-10-30 ENCOUNTER — Other Ambulatory Visit: Payer: Self-pay

## 2019-10-30 ENCOUNTER — Encounter: Payer: Self-pay | Admitting: Family Medicine

## 2019-10-30 ENCOUNTER — Ambulatory Visit (INDEPENDENT_AMBULATORY_CARE_PROVIDER_SITE_OTHER): Payer: Commercial Managed Care - PPO | Admitting: Family Medicine

## 2019-10-30 VITALS — BP 170/83 | HR 95 | Temp 97.6°F | Ht 67.0 in | Wt 146.2 lb

## 2019-10-30 DIAGNOSIS — Z720 Tobacco use: Secondary | ICD-10-CM | POA: Diagnosis not present

## 2019-10-30 DIAGNOSIS — J449 Chronic obstructive pulmonary disease, unspecified: Secondary | ICD-10-CM

## 2019-10-30 DIAGNOSIS — I10 Essential (primary) hypertension: Secondary | ICD-10-CM

## 2019-10-30 MED ORDER — LOSARTAN POTASSIUM-HCTZ 50-12.5 MG PO TABS: 1.0000 | ORAL_TABLET | Freq: Every day | ORAL | 1 refills | Status: DC

## 2019-10-30 NOTE — Patient Instructions (Addendum)
  Goal 130/80-check at work site clinic. If consistently over this number, schedule an appointment to discuss medication change Labwork fasting   If you have lab work done today you will be contacted with your lab results within the next 2 weeks.  If you have not heard from [place] then please contact us. The fastest way to get your results is to register for My Chart.   IF you received an x-ray today, you will receive an invoice from [facility]. Please contact [facility] at [phone] with questions or concerns regarding your invoice.   IF you received labwork today, you will receive an invoice from [facility]. Please contact [facility] at [phone] with questions or concerns regarding your invoice.   Our billing staff will not be able to assist you with questions regarding bills from these companies.  You will be contacted with the lab results as soon as they are available. The fastest way to get your results is to activate your My Chart account. Instructions are located on the last page of this paperwork. If you have not heard from [place] regarding the results in 2 weeks, please contact this office.

## 2019-10-30 NOTE — Progress Notes (Signed)
Established Patient Office Visit  Subjective:  Patient ID: Diana Hubbard, female    DOB: 01-29-1956  Age: 64 y.o. MRN: [id number]  CC:  Chief Complaint  Patient presents with  . Follow-up    6 month f/u. Need refills on med    HPI Diana Hubbard presents for HTN-took Losartan/HCTZ-cough with ACE-no concerns with medication currently.  Increase dose caused dizziness. No headche.   Pt with long term tobacco use-CT lung -small nodule -yearly follow up.  6/20 [place] site clinic  Past Medical History:  Diagnosis Date  . Abnormal Pap smear of cervix 01/12/2016   LSIL will get colpo  . COPD (chronic obstructive pulmonary disease) ([place])   . Hypertension   . Rectocele 01/06/2016  . Vaginal Pap smear, abnormal     Past Surgical History:  Procedure Laterality Date  . APPENDECTOMY    . CHOLECYSTECTOMY    . TUBAL LIGATION      Family History  Problem Relation Age of Onset  . Diabetes Mother   . Hypertension Mother   . Hyperlipidemia Mother   . Other Father        abd aneursym  . Hyperlipidemia Father   . Hypertension Father   . Hemophilia Father   . Cancer Maternal Grandmother        uterine    Social History   Socioeconomic History  . Marital status: Married    Spouse name: Elta Guadeloupe  . Number of children: 2  . Years of education: 47  . Highest education level: Not on file  Occupational History  . Occupation: Product/process development scientist: Engineer, [employer]    Comment: [employer]  Tobacco Use  . Smoking status: Current Every Day Smoker    Packs/day: 0.75    Years: 40.00    Pack years: 30.00    Types: Cigarettes  . Smokeless tobacco: Never Used  Substance and Sexual Activity  . Alcohol use: Yes    Comment: social  . Drug use: No  . Sexual activity: Yes    Birth control/protection: Post-menopausal, Surgical    Comment: tubal  Other Topics Concern  . Not on file  Social History Narrative   Married.  Lives with Husband   Limited caffeine up to 2 -3 cups a day   No reg exercise, walk at work   Social Determinants of Radio broadcast assistant Strain:   . Difficulty of Paying Living Expenses:   Food Insecurity:   . Worried About Charity fundraiser in the Last Year:   . Arboriculturist in the Last Year:   Transportation Needs:   . Film/video editor (Medical):   Marland Kitchen Lack of Transportation (Non-Medical):   Physical Activity:   . Days of Exercise per Week:   . Minutes of Exercise per Session:   Stress:   . Feeling of Stress :   Social Connections:   . Frequency of Communication with Friends and Family:   . Frequency of Social Gatherings with Friends and Family:   . Attends Religious Services:   . Active Member of Clubs or Organizations:   . Attends Archivist Meetings:   Marland Kitchen Marital Status:   Intimate Partner Violence:   . Fear of Current or Ex-Partner:   . Emotionally Abused:   Marland Kitchen Physically Abused:   . Sexually Abused:     Outpatient Medications Prior to Visit  Medication Sig Dispense Refill  . losartan-hydrochlorothiazide (HYZAAR) 50-12.5 MG tablet Take 1 tablet  by mouth daily. 90 tablet 1  . collagenase (SANTYL) ointment Apply to Right ankle daily.  Wet to dry dressing on top. 30 g 0   No facility-administered medications prior to visit.    Allergies  Allergen Reactions  . Ace Inhibitors Cough    cough    ROS Review of Systems  Constitutional: Negative.   Respiratory: Negative.   Cardiovascular: Negative.   Skin: Negative.       Objective:    Physical Exam  Constitutional: She is oriented to person, place, and time. She appears well-developed and well-nourished.  HENT:  Head: Normocephalic and atraumatic.  Cardiovascular: Normal rate, regular rhythm, normal heart sounds and intact distal pulses.  Pulmonary/Chest: Effort normal and breath sounds normal.  Musculoskeletal:        General: Normal range of motion.  Neurological: She is alert and oriented to person, place, and time.  Psychiatric: She has  a normal mood and affect. Her behavior is normal.    BP (!) 170/83 (BP Location: Right Arm)   Pulse 95   Temp 97.6 F (36.4 C) (Temporal)   Ht 5\' 7"  (1.702 m)   Wt 146 lb 3.2 oz (66.3 kg)   SpO2 98%   BMI 22.90 kg/m  Wt Readings from Last 3 Encounters:  10/30/19 146 lb 3.2 oz (66.3 kg)  05/01/19 140 lb 6.4 oz (63.7 kg)  12/27/17 137 lb 8 oz (62.4 kg)     Health Maintenance Due  Topic Date Due  . Hepatitis C Screening  Never done  . HIV Screening  Never done  . COVID-19 Vaccine (1) Never done  . TETANUS/TDAP  Never done  . COLONOSCOPY  Never done  . MAMMOGRAM  01/05/2018  . PAP SMEAR-Modifier  01/06/2019    Lab Results  Component Value Date   TSH 2.35 12/18/2018   Lab Results  Component Value Date   WBC 11.9 12/18/2018   HGB 13.3 12/18/2018   HCT 41 12/18/2018   MCV 100.8 (H) 03/08/2017   PLT 272 03/08/2017   Lab Results  Component Value Date   NA 134 (A) 12/18/2018   K 4.5 12/18/2018   CO2 22 03/08/2017   GLUCOSE 94 03/08/2017   BUN 16 12/18/2018   CREATININE 1.1 12/18/2018   BILITOT 0.6 03/08/2017   AST 23 03/08/2017   ALT 17 03/08/2017   PROT 7.3 03/08/2017   CALCIUM 9.5 03/08/2017   Lab Results  Component Value Date   CHOL 183 12/18/2018   Lab Results  Component Value Date   HDL 51 12/18/2018   Lab Results  Component Value Date   LDLCALC 92 12/18/2018   Lab Results  Component Value Date   TRIG 202 (A) 12/18/2018   Lab Results  Component Value Date   CHOLHDL 3.3 03/08/2017   Lab Results  Component Value Date   HGBA1C 5.2 12/18/2018      Assessment & Plan:  1. Essential hypertension Losartan/HCTZ daily-pt to check blood pressure and f/u if continued elevation Pt to have blood pressure checked at work-goal is 130/80 labwork-pt to call for orders if needed 2. Tobacco use Pt tried medication in the past Lung screening completed 3/21-yearly follow up 3. Chronic obstructive pulmonary disease, unspecified COPD type ([place]) No  inhalers Follow-up: work site clinic   Caffie Sotto Hannah Beat, MD

## 2020-04-22 ENCOUNTER — Ambulatory Visit (INDEPENDENT_AMBULATORY_CARE_PROVIDER_SITE_OTHER): Payer: Commercial Managed Care - PPO | Admitting: Internal Medicine

## 2020-04-22 ENCOUNTER — Other Ambulatory Visit: Payer: Self-pay

## 2020-04-22 ENCOUNTER — Encounter: Payer: Self-pay | Admitting: Internal Medicine

## 2020-04-22 VITALS — BP 161/90 | HR 89 | Temp 97.3°F | Resp 18 | Ht 67.5 in | Wt 151.0 lb

## 2020-04-22 DIAGNOSIS — I10 Essential (primary) hypertension: Secondary | ICD-10-CM | POA: Diagnosis not present

## 2020-04-22 DIAGNOSIS — Z1231 Encounter for screening mammogram for malignant neoplasm of breast: Secondary | ICD-10-CM | POA: Diagnosis not present

## 2020-04-22 DIAGNOSIS — Z124 Encounter for screening for malignant neoplasm of cervix: Secondary | ICD-10-CM

## 2020-04-22 DIAGNOSIS — Z7689 Persons encountering health services in other specified circumstances: Secondary | ICD-10-CM

## 2020-04-22 DIAGNOSIS — I700 Atherosclerosis of aorta: Secondary | ICD-10-CM | POA: Insufficient documentation

## 2020-04-22 DIAGNOSIS — J432 Centrilobular emphysema: Secondary | ICD-10-CM | POA: Diagnosis not present

## 2020-04-22 DIAGNOSIS — Z23 Encounter for immunization: Secondary | ICD-10-CM

## 2020-04-22 DIAGNOSIS — Z720 Tobacco use: Secondary | ICD-10-CM

## 2020-04-22 MED ORDER — LOSARTAN POTASSIUM-HCTZ 100-25 MG PO TABS: 1.0000 | ORAL_TABLET | Freq: Every day | ORAL | 0 refills | Status: DC

## 2020-04-22 NOTE — Patient Instructions (Signed)
Please start taking Losartan-HCTZ 100-25 mg once daily. You can take 2 tablets of 50-12.5 mg till you run out of current medication bottle.  Please check BP at home and bring the log in the next visit.  You are being referred to Ob./Gyn. for PAP smear.  You are being scheduled for Mammography. You are advised to quit smoking as soon as possible.  Please follow DASH diet and moderate exercise, at least 150 mins/week.  DASH stands for Dietary Approaches to Stop Hypertension. The DASH diet is a healthy-eating plan designed to help treat or prevent high blood pressure (hypertension).  The DASH diet includes foods that are rich in potassium, calcium and magnesium. These nutrients help control blood pressure. The diet limits foods that are high in sodium, saturated fat and added sugars.  Studies have shown that the DASH diet can lower blood pressure in as little as two weeks. The diet can also lower low-density lipoprotein (LDL or "bad") cholesterol levels in the blood. High blood pressure and high LDL cholesterol levels are two major risk factors for heart disease and stroke.    DASH diet: Recommended servings The DASH diet provides daily and weekly nutritional goals. The number of servings you should have depends on your daily calorie needs.  Here's a look at the recommended servings from each food group for a 2,000-calorie-a-day DASH diet:  Grains: 6 to 8 servings a day. One serving is one slice bread, 1 ounce dry cereal, or 1/2 cup cooked cereal, rice or pasta. Vegetables: 4 to 5 servings a day. One serving is 1 cup raw leafy green vegetable, 1/2 cup cut-up raw or cooked vegetables, or 1/2 cup vegetable juice. Fruits: 4 to 5 servings a day. One serving is one medium fruit, 1/2 cup fresh, frozen or canned fruit, or 1/2 cup fruit juice. Fat-free or low-fat dairy products: 2 to 3 servings a day. One serving is 1 cup milk or yogurt, or 1 1/2 ounces cheese. Lean meats, poultry and fish: six  1-ounce servings or fewer a day. One serving is 1 ounce cooked meat, poultry or fish, or 1 egg. Nuts, seeds and legumes: 4 to 5 servings a week. One serving is 1/3 cup nuts, 2 tablespoons peanut butter, 2 tablespoons seeds, or 1/2 cup cooked legumes (dried beans or peas). Fats and oils: 2 to 3 servings a day. One serving is 1 teaspoon soft margarine, 1 teaspoon vegetable oil, 1 tablespoon mayonnaise or 2 tablespoons salad dressing. Sweets and added sugars: 5 servings or fewer a week. One serving is 1 tablespoon sugar, jelly or jam, 1/2 cup sorbet, or 1 cup lemonade.

## 2020-04-22 NOTE — Assessment & Plan Note (Signed)
Asked about quitting: confirms that she currently smokes cigarettes Advise to quit smoking: Educated about QUITTING to reduce the risk of cancer, cardio and cerebrovascular disease. Assess willingness: Unwilling to quit at this time, but is working on cutting back. Assist with counseling and pharmacotherapy: Counseled for 5 minutes and literature provided. Arrange for follow up: Follow up in 3 months and continue to offer help. 

## 2020-04-22 NOTE — Assessment & Plan Note (Signed)
Care established Previous chart reviewed History and medications reviewed with the patient  Cologuard ordered Mammogram scheduled Referral to Ob./Gyn. for PAP smear

## 2020-04-22 NOTE — Assessment & Plan Note (Signed)
Noticed on low dose CT chest done for lung cancer screening F/u lipid profile and CMP, plan to start statin in next visit

## 2020-04-22 NOTE — Assessment & Plan Note (Signed)
Noticed on low dose CT chest done for lung cancer screening No active complaints currently Counseled to quit smoking 

## 2020-04-22 NOTE — Assessment & Plan Note (Signed)
BP Readings from Last 1 Encounters:  04/22/20 (!) 161/90   uncontrolled with Losartan-HCTZ 50-12.5 mg QD, increased it to 100-25 mg QD Advised to check BP at home and bring the log in the next visit Counseled for compliance with the medications Advised DASH diet and moderate exercise/walking, at least 150 mins/week F/u CMP and lipid profile

## 2020-04-22 NOTE — Progress Notes (Signed)
New Patient Office Visit  Subjective:  Patient ID: Diana Hubbard, female    DOB: [birth year]  Age: 64 y.o. MRN: [id number]  CC:  Chief Complaint  Patient presents with   New Patient (Initial Visit)    new pt former dr Holly Bodily pt no issues just needs refills on medications     HPI Diana Hubbard is a 64 year old female with past medical history of hypertension female with past medical history of hypertension and emphysema presents for establishing care.  She is a former patient of Dr. Holly Bodily.  Patient patient states that she has been doing well overall, but wanted to get a refill for her blood pressure medicines.  Her BP in the office was 184/93, upon repeat check it was 161/90.  Patient denies any headache, dizziness, chest pain, dyspnea or palpitations. Patient has been taking Hyzaar 50-12.5 mg QD regularly.  Patient smokes 05 packs/day cigarretes for last 40 years. Patient had low-dose CT scanning of chest for lung cancer screening in 09/2019. Centrilobular emphysema and aortic atherosclerosis was noted at that time.  Patient had last PAP smear followed by colposcopy in 2017.  Patient had last Mammography in 2017.  Patient has not had colonoscopy yet. Patient did not send the cologuard package back that was setup from pervious PCP. She is willing to obtain it again.  Patient has had J&J COVID vaccine and is going to get second dose from health department soon. Patient is going to get flu vaccine from her workplace soon. Patient received Shingrix vaccine in the office today. PCV-13 later.  Past Medical History:  Diagnosis Date   Abnormal Pap smear of cervix 01/12/2016   LSIL will get colpo   COPD (chronic obstructive pulmonary disease) (HCC)    Hypertension    Rectocele 01/06/2016   Vaginal Pap smear, abnormal     Past Surgical History:  Procedure Laterality Date   APPENDECTOMY     CHOLECYSTECTOMY     TUBAL LIGATION      Family History  Problem Relation Age of  Onset   Diabetes Mother    Hypertension Mother    Hyperlipidemia Mother    Other Father        abd aneursym   Hyperlipidemia Father    Hypertension Father    Hemophilia Father    Cancer Maternal Grandmother        uterine    Social History   Socioeconomic History   Marital status: Married    Spouse name: Elta Guadeloupe   Number of children: 2   Years of education: 13   Highest education level: Not on file  Occupational History   Occupation: Product/process development scientist: Engineer, water    Comment: [employer]  Tobacco Use   Smoking status: Current Every Day Smoker    Packs/day: 0.75    Years: 40.00    Pack years: 30.00    Types: Cigarettes   Smokeless tobacco: Never Used  Substance and Sexual Activity   Alcohol use: Yes    Comment: social   Drug use: No   Sexual activity: Yes    Birth control/protection: Post-menopausal, Surgical    Comment: tubal  Other Topics Concern   Not on file  Social History Narrative   Married.  Lives with Husband   Limited caffeine up to 2 -3 cups a day   No reg exercise, walk at work   Social Determinants of Radio broadcast assistant Strain:    Difficulty of Paying Living Expenses:  Not on file  Food Insecurity:    Worried About Charity fundraiser in the Last Year: Not on file   Ran Out of Food in the Last Year: Not on file  Transportation Needs:    Lack of Transportation (Medical): Not on file   Lack of Transportation (Non-Medical): Not on file  Physical Activity:    Days of Exercise per Week: Not on file   Minutes of Exercise per Session: Not on file  Stress:    Feeling of Stress : Not on file  Social Connections:    Frequency of Communication with Friends and Family: Not on file   Frequency of Social Gatherings with Friends and Family: Not on file   Attends Religious Services: Not on file   Active Member of Clubs or Organizations: Not on file   Attends Archivist Meetings: Not on file   Marital  Status: Not on file  Intimate Partner Violence:    Fear of Current or Ex-Partner: Not on file   Emotionally Abused: Not on file   Physically Abused: Not on file   Sexually Abused: Not on file    ROS Review of Systems  Constitutional: Negative for chills and fever.  HENT: Negative for congestion, sinus pressure, sinus pain and sore throat.   Eyes: Negative for pain and discharge.  Respiratory: Negative for cough and shortness of breath.   Cardiovascular: Negative for chest pain and palpitations.  Gastrointestinal: Negative for abdominal pain, constipation, diarrhea, nausea and vomiting.  Endocrine: Negative for polydipsia and polyuria.  Genitourinary: Negative for dysuria and hematuria.  Musculoskeletal: Negative for neck pain and neck stiffness.  Skin: Negative for rash.  Neurological: Negative for dizziness, seizures, syncope and weakness.  Psychiatric/Behavioral: Negative for agitation and behavioral problems.    Objective:   Today's Vitals: BP (!) 161/90 (BP Location: Right Arm, Patient Position: Sitting)    Pulse 89    Temp (!) 97.3 F (36.3 C) (Temporal)    Resp 18    Ht 5' 7.5" (1.715 m)    Wt 151 lb (68.5 kg)    SpO2 99%    BMI 23.30 kg/m   Physical Exam Vitals reviewed.  Constitutional:      General: She is not in acute distress.    Appearance: She is not diaphoretic.  HENT:     Head: Normocephalic and atraumatic.     Nose: Nose normal.     Mouth/Throat:     Mouth: Mucous membranes are moist.  Eyes:     General: No scleral icterus.    Extraocular Movements: Extraocular movements intact.     Pupils: Pupils are equal, round, and reactive to light.  Cardiovascular:     Rate and Rhythm: Normal rate and regular rhythm.     Pulses: Normal pulses.     Heart sounds: Normal heart sounds. No murmur heard.  No friction rub. No gallop.   Pulmonary:     Breath sounds: Normal breath sounds. No wheezing or rales.  Abdominal:     Palpations: Abdomen is soft.      Tenderness: There is no abdominal tenderness.  Musculoskeletal:     Cervical back: Neck supple. No tenderness.     Right lower leg: No edema.     Left lower leg: No edema.  Skin:    General: Skin is warm.     Findings: No rash.  Neurological:     General: No focal deficit present.     Mental Status: She is alert  and oriented to person, place, and time.     Sensory: No sensory deficit.     Motor: No weakness.  Psychiatric:        Mood and Affect: Mood normal.        Behavior: Behavior normal.     Assessment & Plan:   Problem List Items Addressed This Visit      Encounter to establish care - Primary   Care established Previous chart reviewed History and medications reviewed with the patient  Cologuard ordered Mammogram scheduled Referral to Ob./Gyn. for PAP smear     Relevant Orders  CBC with Differential  CMP14+EGFR  Lipid Profile  TSH  Vitamin D (25 hydroxy)    Cardiovascular and Mediastinum   Essential hypertension    BP Readings from Last 1 Encounters:  04/22/20 (!) 161/90   uncontrolled with Losartan-HCTZ 50-12.5 mg QD, increased it to 100-25 mg QD Advised to check BP at home and bring the log in the next visit Counseled for compliance with the medications Advised DASH diet and moderate exercise/walking, at least 150 mins/week F/u CMP and lipid profile       Relevant Medications   losartan-hydrochlorothiazide (HYZAAR) 100-25 MG tablet   Aortic atherosclerosis (HCC)    Noticed on low dose CT chest done for lung cancer screening F/u lipid profile and CMP, plan to start statin in next visit      Relevant Medications   losartan-hydrochlorothiazide (HYZAAR) 100-25 MG tablet     Respiratory   Centrilobular emphysema ([place])    Noticed on low dose CT chest done for lung cancer screening No active complaints currently Counseled to quit smoking        Other   Tobacco use    Asked about quitting: confirms that she currently smokes cigarettes Advise to  quit smoking: Educated about QUITTING to reduce the risk of cancer, cardio and cerebrovascular disease. Assess willingness: Unwilling to quit at this time, but is working on cutting back. Assist with counseling and pharmacotherapy: Counseled for 5 minutes and literature provided. Arrange for follow up: Follow up in 3 months and continue to offer help.       Other Visit Diagnoses    Screening mammogram for breast cancer       Relevant Orders   MM Digital Screening   Routine cervical smear       Relevant Orders   Ambulatory referral to Obstetrics / Gynecology   Immunization due       Relevant Orders   Varicella-zoster vaccine IM (Completed)      Outpatient Encounter Medications as of 04/22/2020  Medication Sig   [DISCONTINUED] losartan-hydrochlorothiazide (HYZAAR) 50-12.5 MG tablet Take 1 tablet by mouth daily.   losartan-hydrochlorothiazide (HYZAAR) 100-25 MG tablet Take 1 tablet by mouth daily.   No facility-administered encounter medications on file as of 04/22/2020.    Follow-up: Return in about 4 weeks (around 05/20/2020).   Lindell Spar, MD

## 2020-05-21 ENCOUNTER — Ambulatory Visit: Payer: Commercial Managed Care - PPO | Admitting: Internal Medicine

## 2020-05-21 ENCOUNTER — Other Ambulatory Visit: Payer: Self-pay | Admitting: Family Medicine

## 2020-06-08 ENCOUNTER — Other Ambulatory Visit: Payer: Self-pay | Admitting: Internal Medicine

## 2020-06-08 DIAGNOSIS — Z1231 Encounter for screening mammogram for malignant neoplasm of breast: Secondary | ICD-10-CM

## 2020-06-17 ENCOUNTER — Ambulatory Visit (INDEPENDENT_AMBULATORY_CARE_PROVIDER_SITE_OTHER): Payer: Commercial Managed Care - PPO | Admitting: Internal Medicine

## 2020-06-17 ENCOUNTER — Other Ambulatory Visit: Payer: Self-pay

## 2020-06-17 ENCOUNTER — Encounter: Payer: Self-pay | Admitting: Internal Medicine

## 2020-06-17 VITALS — BP 145/78 | HR 81 | Temp 98.6°F | Resp 18 | Ht 68.0 in | Wt 148.0 lb

## 2020-06-17 DIAGNOSIS — E782 Mixed hyperlipidemia: Secondary | ICD-10-CM

## 2020-06-17 DIAGNOSIS — N1832 Chronic kidney disease, stage 3b: Secondary | ICD-10-CM | POA: Insufficient documentation

## 2020-06-17 DIAGNOSIS — I10 Essential (primary) hypertension: Secondary | ICD-10-CM

## 2020-06-17 DIAGNOSIS — Z1211 Encounter for screening for malignant neoplasm of colon: Secondary | ICD-10-CM

## 2020-06-17 DIAGNOSIS — I700 Atherosclerosis of aorta: Secondary | ICD-10-CM

## 2020-06-17 DIAGNOSIS — N1831 Chronic kidney disease, stage 3a: Secondary | ICD-10-CM

## 2020-06-17 HISTORY — DX: Chronic kidney disease, stage 3a: N18.31

## 2020-06-17 HISTORY — DX: Mixed hyperlipidemia: E78.2

## 2020-06-17 MED ORDER — ROSUVASTATIN CALCIUM 10 MG PO TABS: 10.0000 mg | ORAL_TABLET | Freq: Every day | ORAL | 1 refills | Status: DC

## 2020-06-17 MED ORDER — LOSARTAN POTASSIUM-HCTZ 100-25 MG PO TABS: 1.0000 | ORAL_TABLET | Freq: Every day | ORAL | 1 refills | Status: DC

## 2020-06-17 NOTE — Patient Instructions (Addendum)
Please continue to take Lisinopril-HCTZ as prescribed.  Please start taking Crestor as prescribed.  Please continue to follow DASH diet and perform moderate exercise/walking at least 150 mins/week.

## 2020-06-17 NOTE — Assessment & Plan Note (Signed)
BP Readings from Last 1 Encounters:  06/17/20 (!) 145/78   Overall stable with Losartan-HCTZ 100-25 mg QD Has been stressed recently regarding her husband's health Counseled for compliance with the medications Advised DASH diet and moderate exercise/walking, at least 150 mins/week

## 2020-06-17 NOTE — Assessment & Plan Note (Addendum)
CMP reviewed, rise in Cr. (1.38 from 1.07) compared to previous BMP in 2020 Avoid nephrotoxic agents like NSAIDs Would continue Losartan-HCTZ for now, advised to increase fluid intake to at least 60 oz. Recheck BMP before next visit

## 2020-06-17 NOTE — Progress Notes (Signed)
Established Patient Office Visit  Subjective:  Patient ID: Diana Hubbard, female    DOB: 1955/09/19  Age: 64 y.o. MRN: [id number]  CC:  Chief Complaint  Patient presents with  . Follow-up    1 month follow up lab review     HPI Diana Hubbard is a 65 year old female with PMH of HTN, emphysema, HLD, aortic atherosclerosis and tobacco abuse who presents for follow up of her BP and blood tests review.  Her BP was 145/78 in the office today. She has been stressed with her husband's health. She denies any headache, dizziness, chest pain, dyspnea or palpitations. She takes her medications regularly.  Blood tests were reviewed and discussed with the patient in detail.  She has not received the Cologuard kit yet. She still has to reschedule her Mammogram.  Past Medical History:  Diagnosis Date  . Abnormal Pap smear of cervix 01/12/2016   LSIL will get colpo  . COPD (chronic obstructive pulmonary disease) ([place])   . Hypertension   . Mixed hyperlipidemia 06/17/2020  . Rectocele 01/06/2016  . Stage 3a chronic kidney disease ([place]) 06/17/2020  . Vaginal Pap smear, abnormal     Past Surgical History:  Procedure Laterality Date  . APPENDECTOMY    . CHOLECYSTECTOMY    . TUBAL LIGATION      Family History  Problem Relation Age of Onset  . Diabetes Mother   . Hypertension Mother   . Hyperlipidemia Mother   . Other Father        abd aneursym  . Hyperlipidemia Father   . Hypertension Father   . Hemophilia Father   . Cancer Maternal Grandmother        uterine    Social History   Socioeconomic History  . Marital status: Married    Spouse name: Diana Hubbard  . Number of children: 2  . Years of education: 51  . Highest education level: Not on file  Occupational History  . Occupation: Product/process development scientist: Engineer, water    Comment: [employer]  Tobacco Use  . Smoking status: Current Every Day Smoker    Packs/day: 0.75    Years: 40.00    Pack years: 30.00    Types: Cigarettes   . Smokeless tobacco: Never Used  Substance and Sexual Activity  . Alcohol use: Yes    Comment: social  . Drug use: No  . Sexual activity: Yes    Birth control/protection: Post-menopausal, Surgical    Comment: tubal  Other Topics Concern  . Not on file  Social History Narrative   Married.  Lives with Husband   Limited caffeine up to 2 -3 cups a day   No reg exercise, walk at work   Social Determinants of Radio broadcast assistant Strain: Not on file  Food Insecurity: Not on file  Transportation Needs: Not on file  Physical Activity: Not on file  Stress: Not on file  Social Connections: Not on file  Intimate Partner Violence: Not on file    Outpatient Medications Prior to Visit  Medication Sig Dispense Refill  . losartan-hydrochlorothiazide (HYZAAR) 100-25 MG tablet Take 1 tablet by mouth daily. 90 tablet 0   No facility-administered medications prior to visit.    Allergies  Allergen Reactions  . Ace Inhibitors Cough    cough    ROS Review of Systems  Constitutional: Negative for chills and fever.  HENT: Negative for congestion, sinus pressure, sinus pain and sore throat.   Eyes: Negative for  pain and discharge.  Respiratory: Negative for cough and shortness of breath.   Cardiovascular: Negative for chest pain and palpitations.  Gastrointestinal: Negative for abdominal pain, constipation, diarrhea, nausea and vomiting.  Endocrine: Negative for polydipsia and polyuria.  Genitourinary: Negative for dysuria and hematuria.  Musculoskeletal: Negative for neck pain and neck stiffness.  Skin: Negative for rash.  Neurological: Negative for dizziness, seizures, syncope and weakness.  Psychiatric/Behavioral: Negative for agitation and behavioral problems.      Objective:    Physical Exam Vitals reviewed.  Constitutional:      General: She is not in acute distress.    Appearance: She is not diaphoretic.  HENT:     Head: Normocephalic and atraumatic.     Nose:  Nose normal.     Mouth/Throat:     Mouth: Mucous membranes are moist.  Eyes:     General: No scleral icterus.    Extraocular Movements: Extraocular movements intact.     Pupils: Pupils are equal, round, and reactive to light.  Neck:     Vascular: No carotid bruit.  Cardiovascular:     Rate and Rhythm: Normal rate and regular rhythm.     Pulses: Normal pulses.     Heart sounds: Normal heart sounds. No murmur heard. No friction rub. No gallop.   Pulmonary:     Breath sounds: Normal breath sounds. No wheezing or rales.  Abdominal:     Palpations: Abdomen is soft.     Tenderness: There is no abdominal tenderness.  Musculoskeletal:     Cervical back: Neck supple. No tenderness.     Right lower leg: No edema.     Left lower leg: No edema.  Skin:    General: Skin is warm.     Findings: No rash.  Neurological:     General: No focal deficit present.     Mental Status: She is alert and oriented to person, place, and time.     Sensory: No sensory deficit.     Motor: No weakness.  Psychiatric:        Mood and Affect: Mood normal.        Behavior: Behavior normal.     BP (!) 145/78 (BP Location: Left Arm, Patient Position: Sitting)   Pulse 81   Temp 98.6 F (37 C) (Oral)   Resp 18   Ht '5\' 8"'  (1.727 m)   Wt 148 lb (67.1 kg)   SpO2 100%   BMI 22.50 kg/m  Wt Readings from Last 3 Encounters:  06/17/20 148 lb (67.1 kg)  04/22/20 151 lb (68.5 kg)  10/30/19 146 lb 3.2 oz (66.3 kg)     Health Maintenance Due  Topic Date Due  . Hepatitis C Screening  Never done  . HIV Screening  Never done  . TETANUS/TDAP  Never done  . COLONOSCOPY  Never done  . MAMMOGRAM  01/05/2018  . PAP SMEAR-Modifier  01/06/2019  . COVID-19 Vaccine (2 - Booster for [employer] series) 11/26/2019  . DEXA SCAN  Never done  . PNA vac Low Risk Adult (1 of 2 - PCV13) Never done    There are no preventive care reminders to display for this patient.  Lab Results  Component Value Date   TSH 2.35 12/18/2018    Lab Results  Component Value Date   WBC 11.9 12/18/2018   HGB 13.3 12/18/2018   HCT 41 12/18/2018   MCV 100.8 (H) 03/08/2017   PLT 272 03/08/2017   Lab Results  Component Value  Date   NA 134 (A) 12/18/2018   K 4.5 12/18/2018   CO2 22 03/08/2017   GLUCOSE 94 03/08/2017   BUN 16 12/18/2018   CREATININE 1.1 12/18/2018   BILITOT 0.6 03/08/2017   AST 23 03/08/2017   ALT 17 03/08/2017   PROT 7.3 03/08/2017   CALCIUM 9.5 03/08/2017   Lab Results  Component Value Date   CHOL 183 12/18/2018   Lab Results  Component Value Date   HDL 51 12/18/2018   Lab Results  Component Value Date   LDLCALC 92 12/18/2018   Lab Results  Component Value Date   TRIG 202 (A) 12/18/2018   Lab Results  Component Value Date   CHOLHDL 3.3 03/08/2017   Lab Results  Component Value Date   HGBA1C 5.2 12/18/2018      Assessment & Plan:   Problem List Items Addressed This Visit      Cardiovascular and Mediastinum   Uncontrolled hypertension - Primary    BP Readings from Last 1 Encounters:  06/17/20 (!) 145/78   Overall stable with Losartan-HCTZ 100-25 mg QD Has been stressed recently regarding her husband's health Counseled for compliance with the medications Advised DASH diet and moderate exercise/walking, at least 150 mins/week       Relevant Medications   losartan-hydrochlorothiazide (HYZAAR) 100-25 MG tablet   rosuvastatin (CRESTOR) 10 MG tablet   Other Relevant Orders   Basic Metabolic Panel (BMET)   Aortic atherosclerosis ([place])    Noticed on low dose CT chest done for lung cancer screening Lipid profile reviewed Started Crestor 10 mg QD      Relevant Medications   losartan-hydrochlorothiazide (HYZAAR) 100-25 MG tablet   rosuvastatin (CRESTOR) 10 MG tablet     Genitourinary   Stage 3a chronic kidney disease ([place])    CMP reviewed, rise in Cr. (1.38 from 1.07) compared to previous BMP in 2020 Avoid nephrotoxic agents like NSAIDs Would continue Losartan-HCTZ for now,  advised to increase fluid intake to at least 60 oz. Recheck BMP before next visit      Relevant Orders   Basic Metabolic Panel (BMET)     Other   Mixed hyperlipidemia    Lipid profile reviewed, LDL - 119 Started Crestor 10 mg QD in light of HTN and aortic atherosclerosis      Relevant Medications   losartan-hydrochlorothiazide (HYZAAR) 100-25 MG tablet   rosuvastatin (CRESTOR) 10 MG tablet    Other Visit Diagnoses    Special screening for malignant neoplasms, colon       Relevant Orders   Cologuard      Meds ordered this encounter  Medications  . losartan-hydrochlorothiazide (HYZAAR) 100-25 MG tablet    Sig: Take 1 tablet by mouth daily.    Dispense:  90 tablet    Refill:  1  . rosuvastatin (CRESTOR) 10 MG tablet    Sig: Take 1 tablet (10 mg total) by mouth daily.    Dispense:  90 tablet    Refill:  1    Follow-up: Return in about 3 months (around 09/15/2020) for Recheck BP, Review BMP and Shingrix dose -2 and PCV13.Lindell Spar, MD

## 2020-06-17 NOTE — Assessment & Plan Note (Signed)
Noticed on low dose CT chest done for lung cancer screening Lipid profile reviewed Started Crestor 10 mg QD

## 2020-06-17 NOTE — Assessment & Plan Note (Addendum)
Lipid profile reviewed, LDL - 119 Started Crestor 10 mg QD in light of HTN and aortic atherosclerosis

## 2020-07-20 ENCOUNTER — Other Ambulatory Visit: Payer: Self-pay | Admitting: Internal Medicine

## 2020-07-20 DIAGNOSIS — I10 Essential (primary) hypertension: Secondary | ICD-10-CM

## 2020-09-15 ENCOUNTER — Encounter: Payer: Self-pay | Admitting: Internal Medicine

## 2020-09-15 ENCOUNTER — Ambulatory Visit (INDEPENDENT_AMBULATORY_CARE_PROVIDER_SITE_OTHER): Payer: Commercial Managed Care - PPO | Admitting: Internal Medicine

## 2020-09-15 ENCOUNTER — Other Ambulatory Visit: Payer: Self-pay

## 2020-09-15 VITALS — BP 158/86 | HR 98 | Temp 98.6°F | Resp 18 | Ht 67.5 in | Wt 148.4 lb

## 2020-09-15 DIAGNOSIS — I700 Atherosclerosis of aorta: Secondary | ICD-10-CM

## 2020-09-15 DIAGNOSIS — I10 Essential (primary) hypertension: Secondary | ICD-10-CM

## 2020-09-15 DIAGNOSIS — N1831 Chronic kidney disease, stage 3a: Secondary | ICD-10-CM

## 2020-09-15 DIAGNOSIS — Z23 Encounter for immunization: Secondary | ICD-10-CM

## 2020-09-15 DIAGNOSIS — Z1211 Encounter for screening for malignant neoplasm of colon: Secondary | ICD-10-CM

## 2020-09-15 MED ORDER — AMLODIPINE BESYLATE 5 MG PO TABS: 5.0000 mg | ORAL_TABLET | Freq: Every day | ORAL | 0 refills | Status: DC

## 2020-09-15 NOTE — Patient Instructions (Signed)
Please start taking Amlodipine as prescribed. Continue taking other medications as prescribed.  Please follow DASH diet and perform moderate exercise/walking at least 150 mins/week.  Please increase water intake to at least 2 liters per day to avoid dehydration.

## 2020-09-15 NOTE — Assessment & Plan Note (Signed)
Last BMP reviewed Advised to increase water intake Will check BMP after 3 months, if persistent elevated Cr. with low GFR, will refer to Nephrology

## 2020-09-15 NOTE — Assessment & Plan Note (Signed)
Lipid profile reviewed Continue Crestor 10 mg QD

## 2020-09-15 NOTE — Assessment & Plan Note (Addendum)
BP Readings from Last 1 Encounters:  09/15/20 (!) 158/86   Uncontrolled with Losartan-HCTZ 100-25 mg QD Added Amlodipine 5 mg QD Has been stressed recently regarding her husband's health Counseled for compliance with the medications Advised DASH diet and moderate exercise/walking, at least 150 mins/week

## 2020-09-15 NOTE — Progress Notes (Signed)
Established Patient Office Visit  Subjective:  Patient ID: Diana Hubbard, female    DOB: 1955/08/02  Age: 65 y.o. MRN: [id number]  CC:  Chief Complaint  Patient presents with  . Follow-up    3 month follow up no issues right now     HPI Diana Hubbard is a 65 year old female with PMH of HTN and HLD who presents for follow up of her chronic medical conditions.  HTN: Her BP was elevated today. She has been taking Losartan-HCTZ regularly. Denies any headache, dizziness, chest pain, dyspnea or palpitations.  HLD: On Crestor. Lipid profile reviewed.  CKD: She states that she has poor PO intake of fluids. She prefers to increase water intake for now and if persistent elevated kidney function tests, she will see a Nephrologist. Denies urinary hesitancy, dysuria or hematuria.  She has not received cologuard yet.  Past Medical History:  Diagnosis Date  . Abnormal Pap smear of cervix 01/12/2016   LSIL will get colpo  . COPD (chronic obstructive pulmonary disease) ([place])   . Hypertension   . Mixed hyperlipidemia 06/17/2020  . Rectocele 01/06/2016  . Stage 3a chronic kidney disease ([place]) 06/17/2020  . Vaginal Pap smear, abnormal     Past Surgical History:  Procedure Laterality Date  . APPENDECTOMY    . CHOLECYSTECTOMY    . TUBAL LIGATION      Family History  Problem Relation Age of Onset  . Diabetes Mother   . Hypertension Mother   . Hyperlipidemia Mother   . Other Father        abd aneursym  . Hyperlipidemia Father   . Hypertension Father   . Hemophilia Father   . Cancer Maternal Grandmother        uterine    Social History   Socioeconomic History  . Marital status: Married    Spouse name: Elta Guadeloupe  . Number of children: 2  . Years of education: 57  . Highest education level: Not on file  Occupational History  . Occupation: Product/process development scientist: Engineer, water    Comment: [employer]  Tobacco Use  . Smoking status: Current Every Day Smoker    Packs/day: 0.75     Years: 40.00    Pack years: 30.00    Types: Cigarettes  . Smokeless tobacco: Never Used  Substance and Sexual Activity  . Alcohol use: Yes    Comment: social  . Drug use: No  . Sexual activity: Yes    Birth control/protection: Post-menopausal, Surgical    Comment: tubal  Other Topics Concern  . Not on file  Social History Narrative   Married.  Lives with Husband   Limited caffeine up to 2 -3 cups a day   No reg exercise, walk at work   Social Determinants of Radio broadcast assistant Strain: Not on file  Food Insecurity: Not on file  Transportation Needs: Not on file  Physical Activity: Not on file  Stress: Not on file  Social Connections: Not on file  Intimate Partner Violence: Not on file    Outpatient Medications Prior to Visit  Medication Sig Dispense Refill  . losartan-hydrochlorothiazide (HYZAAR) 100-25 MG tablet TAKE 1 TABLET BY MOUTH DAILY 90 tablet 1  . rosuvastatin (CRESTOR) 10 MG tablet Take 1 tablet (10 mg total) by mouth daily. 90 tablet 1   No facility-administered medications prior to visit.    Allergies  Allergen Reactions  . Ace Inhibitors Cough    cough  ROS Review of Systems  Constitutional: Negative for chills and fever.  HENT: Negative for congestion, sinus pressure, sinus pain and sore throat.   Eyes: Negative for pain and discharge.  Respiratory: Negative for cough and shortness of breath.   Cardiovascular: Negative for chest pain and palpitations.  Gastrointestinal: Negative for abdominal pain, constipation, diarrhea, nausea and vomiting.  Endocrine: Negative for polydipsia and polyuria.  Genitourinary: Negative for dysuria and hematuria.  Musculoskeletal: Negative for neck pain and neck stiffness.  Skin: Negative for rash.  Neurological: Negative for dizziness, seizures, syncope and weakness.  Psychiatric/Behavioral: Negative for agitation and behavioral problems.      Objective:    Physical Exam Vitals reviewed.   Constitutional:      General: She is not in acute distress.    Appearance: She is not diaphoretic.  HENT:     Head: Normocephalic and atraumatic.     Nose: Nose normal.     Mouth/Throat:     Mouth: Mucous membranes are moist.  Eyes:     General: No scleral icterus.    Extraocular Movements: Extraocular movements intact.     Pupils: Pupils are equal, round, and reactive to light.  Neck:     Vascular: No carotid bruit.  Cardiovascular:     Rate and Rhythm: Normal rate and regular rhythm.     Pulses: Normal pulses.     Heart sounds: Normal heart sounds. No murmur heard. No friction rub. No gallop.   Pulmonary:     Breath sounds: Normal breath sounds. No wheezing or rales.  Abdominal:     Palpations: Abdomen is soft.     Tenderness: There is no abdominal tenderness.  Musculoskeletal:     Cervical back: Neck supple. No tenderness.     Right lower leg: No edema.     Left lower leg: No edema.  Skin:    General: Skin is warm.     Findings: No rash.  Neurological:     General: No focal deficit present.     Mental Status: She is alert and oriented to person, place, and time.     Sensory: No sensory deficit.     Motor: No weakness.  Psychiatric:        Mood and Affect: Mood normal.        Behavior: Behavior normal.     BP (!) 158/86 (BP Location: Right Arm, Patient Position: Sitting, Cuff Size: Normal)   Pulse 98   Temp 98.6 F (37 C) (Oral)   Resp 18   Ht 5' 7.5" (1.715 m)   Wt 148 lb 6.4 oz (67.3 kg)   SpO2 100%   BMI 22.90 kg/m  Wt Readings from Last 3 Encounters:  09/15/20 148 lb 6.4 oz (67.3 kg)  06/17/20 148 lb (67.1 kg)  04/22/20 151 lb (68.5 kg)     Health Maintenance Due  Topic Date Due  . Hepatitis C Screening  Never done  . HIV Screening  Never done  . TETANUS/TDAP  Never done  . COLONOSCOPY (Pts 45-14yrs Insurance coverage will need to be confirmed)  Never done  . MAMMOGRAM  01/05/2018  . PAP SMEAR-Modifier  01/06/2019  . COVID-19 Vaccine (2 -  Booster for [employer] series) 11/26/2019  . DEXA SCAN  Never done  . PNA vac Low Risk Adult (1 of 2 - PCV13) Never done    There are no preventive care reminders to display for this patient.  Lab Results  Component Value Date   TSH 2.35  12/18/2018   Lab Results  Component Value Date   WBC 11.9 12/18/2018   HGB 13.3 12/18/2018   HCT 41 12/18/2018   MCV 100.8 (H) 03/08/2017   PLT 272 03/08/2017   Lab Results  Component Value Date   NA 134 (A) 12/18/2018   K 4.5 12/18/2018   CO2 22 03/08/2017   GLUCOSE 94 03/08/2017   BUN 16 12/18/2018   CREATININE 1.1 12/18/2018   BILITOT 0.6 03/08/2017   AST 23 03/08/2017   ALT 17 03/08/2017   PROT 7.3 03/08/2017   CALCIUM 9.5 03/08/2017   Lab Results  Component Value Date   CHOL 183 12/18/2018   Lab Results  Component Value Date   HDL 51 12/18/2018   Lab Results  Component Value Date   LDLCALC 92 12/18/2018   Lab Results  Component Value Date   TRIG 202 (A) 12/18/2018   Lab Results  Component Value Date   CHOLHDL 3.3 03/08/2017   Lab Results  Component Value Date   HGBA1C 5.2 12/18/2018      Assessment & Plan:   Problem List Items Addressed This Visit      Cardiovascular and Mediastinum   Uncontrolled hypertension - Primary    BP Readings from Last 1 Encounters:  09/15/20 (!) 158/86   Uncontrolled with Losartan-HCTZ 100-25 mg QD Added Amlodipine 5 mg QD Has been stressed recently regarding her husband's health Counseled for compliance with the medications Advised DASH diet and moderate exercise/walking, at least 150 mins/week      Relevant Medications   amLODipine (NORVASC) 5 MG tablet   Aortic atherosclerosis ([facility])    Lipid profile reviewed Continue Crestor 10 mg QD      Relevant Medications   amLODipine (NORVASC) 5 MG tablet     Genitourinary   Stage 3a chronic kidney disease ([place])    Last BMP reviewed Advised to increase water intake Will check BMP after 3 months, if persistent elevated Cr.  with low GFR, will refer to Nephrology      Relevant Orders   BASIC METABOLIC PANEL WITH GFR   Parathyroid hormone, intact (no Ca)      Meds ordered this encounter  Medications  . amLODipine (NORVASC) 5 MG tablet    Sig: Take 1 tablet (5 mg total) by mouth daily.    Dispense:  90 tablet    Refill:  0    Follow-up: Return in about 3 months (around 12/16/2020).    Lindell Spar, MD

## 2020-12-13 ENCOUNTER — Other Ambulatory Visit: Payer: Self-pay | Admitting: Internal Medicine

## 2020-12-13 DIAGNOSIS — E782 Mixed hyperlipidemia: Secondary | ICD-10-CM

## 2020-12-13 DIAGNOSIS — I10 Essential (primary) hypertension: Secondary | ICD-10-CM

## 2020-12-16 ENCOUNTER — Ambulatory Visit: Payer: Commercial Managed Care - PPO | Admitting: Internal Medicine

## 2021-01-10 ENCOUNTER — Ambulatory Visit: Payer: Commercial Managed Care - PPO | Admitting: Internal Medicine

## 2021-01-11 ENCOUNTER — Other Ambulatory Visit: Payer: Self-pay | Admitting: Internal Medicine

## 2021-01-11 DIAGNOSIS — I10 Essential (primary) hypertension: Secondary | ICD-10-CM

## 2021-02-07 ENCOUNTER — Ambulatory Visit: Payer: Commercial Managed Care - PPO | Admitting: Internal Medicine

## 2021-02-11 ENCOUNTER — Ambulatory Visit: Payer: Commercial Managed Care - PPO | Admitting: Internal Medicine

## 2021-02-24 ENCOUNTER — Ambulatory Visit: Payer: Commercial Managed Care - PPO | Admitting: Internal Medicine

## 2021-03-18 ENCOUNTER — Other Ambulatory Visit: Payer: Self-pay | Admitting: Internal Medicine

## 2021-03-18 DIAGNOSIS — I10 Essential (primary) hypertension: Secondary | ICD-10-CM

## 2021-03-23 ENCOUNTER — Encounter: Payer: Self-pay | Admitting: Internal Medicine

## 2021-03-23 ENCOUNTER — Ambulatory Visit (INDEPENDENT_AMBULATORY_CARE_PROVIDER_SITE_OTHER): Payer: Commercial Managed Care - PPO | Admitting: Internal Medicine

## 2021-03-23 ENCOUNTER — Other Ambulatory Visit: Payer: Self-pay

## 2021-03-23 VITALS — BP 130/72 | HR 110 | Temp 98.3°F | Resp 18 | Ht 67.0 in | Wt 145.1 lb

## 2021-03-23 DIAGNOSIS — N2581 Secondary hyperparathyroidism of renal origin: Secondary | ICD-10-CM

## 2021-03-23 DIAGNOSIS — I10 Essential (primary) hypertension: Secondary | ICD-10-CM | POA: Diagnosis not present

## 2021-03-23 DIAGNOSIS — Z114 Encounter for screening for human immunodeficiency virus [HIV]: Secondary | ICD-10-CM

## 2021-03-23 DIAGNOSIS — Z0001 Encounter for general adult medical examination with abnormal findings: Secondary | ICD-10-CM

## 2021-03-23 DIAGNOSIS — E782 Mixed hyperlipidemia: Secondary | ICD-10-CM

## 2021-03-23 DIAGNOSIS — I700 Atherosclerosis of aorta: Secondary | ICD-10-CM | POA: Diagnosis not present

## 2021-03-23 DIAGNOSIS — N1832 Chronic kidney disease, stage 3b: Secondary | ICD-10-CM

## 2021-03-23 DIAGNOSIS — Z0000 Encounter for general adult medical examination without abnormal findings: Secondary | ICD-10-CM

## 2021-03-23 DIAGNOSIS — Z1159 Encounter for screening for other viral diseases: Secondary | ICD-10-CM

## 2021-03-23 MED ORDER — DAPAGLIFLOZIN PROPANEDIOL 5 MG PO TABS: 5.0000 mg | ORAL_TABLET | Freq: Every day | ORAL | 2 refills | Status: DC

## 2021-03-23 NOTE — Assessment & Plan Note (Signed)
Likely due to CKD Check vitamin D and UA

## 2021-03-23 NOTE — Progress Notes (Signed)
Established Patient Office Visit  Subjective:  Patient ID: Diana Hubbard, female    DOB: 1955-07-16  Age: 65 y.o. MRN: [id number]  CC:  Chief Complaint  Patient presents with   Follow-up    3 month follow up    HPI Diana Hubbard  is a 65 year old female with PMH of HTN and HLD who presents for follow up of her chronic medical conditions.   HTN: Her BP was elevated today. She has been taking Losartan-HCTZ regularly. Denies any headache, dizziness, chest pain, dyspnea or palpitations.   HLD: On Crestor. Lipid profile reviewed.   CKD: She states that she has poor PO intake of fluids. She prefers to increase water intake for now and if persistent elevated kidney function tests, she will see a Nephrologist. Denies urinary hesitancy, dysuria or hematuria. She is willing to start Wilder [place]ade now.   She has not sent cologuard kit yet.  Past Medical History:  Diagnosis Date   Abnormal Pap smear of cervix 01/12/2016   LSIL will get colpo   COPD (chronic obstructive pulmonary disease) ([facility])    Hypertension    Mixed hyperlipidemia 06/17/2020   Rectocele 01/06/2016   Stage 3a chronic kidney disease ([place]) 06/17/2020   Vaginal Pap smear, abnormal     Past Surgical History:  Procedure Laterality Date   APPENDECTOMY     CHOLECYSTECTOMY     TUBAL LIGATION      Family History  Problem Relation Age of Onset   Diabetes Mother    Hypertension Mother    Hyperlipidemia Mother    Other Father        abd aneursym   Hyperlipidemia Father    Hypertension Father    Hemophilia Father    Cancer Maternal Grandmother        uterine    Social History   Socioeconomic History   Marital status: Married    Spouse name: Elta Guadeloupe   Number of children: 2   Years of education: 13   Highest education level: Not on file  Occupational History   Occupation: Product/process development scientist: Engineer, water    Comment: [employer]  Tobacco Use   Smoking status: Every Day    Packs/day: 0.75    Years: 40.00     Pack years: 30.00    Types: Cigarettes   Smokeless tobacco: Never  Substance and Sexual Activity   Alcohol use: Yes    Comment: social   Drug use: No   Sexual activity: Yes    Birth control/protection: Post-menopausal, Surgical    Comment: tubal  Other Topics Concern   Not on file  Social History Narrative   Married.  Lives with Husband   Limited caffeine up to 2 -3 cups a day   No reg exercise, walk at work   Social Determinants of Radio broadcast assistant Strain: Not on file  Food Insecurity: Not on file  Transportation Needs: Not on file  Physical Activity: Not on file  Stress: Not on file  Social Connections: Not on file  Intimate Partner Violence: Not on file    Outpatient Medications Prior to Visit  Medication Sig Dispense Refill   amLODipine (NORVASC) 5 MG tablet TAKE 1 TABLET(5 MG) BY MOUTH DAILY 90 tablet 0   losartan-hydrochlorothiazide (HYZAAR) 100-25 MG tablet TAKE 1 TABLET BY MOUTH DAILY 90 tablet 1   rosuvastatin (CRESTOR) 10 MG tablet TAKE 1 TABLET(10 MG) BY MOUTH DAILY 90 tablet 1   No facility-administered medications prior to  visit.    Allergies  Allergen Reactions   Ace Inhibitors Cough    cough    ROS Review of Systems  Constitutional:  Negative for chills and fever.  HENT:  Negative for congestion, sinus pressure, sinus pain and sore throat.   Eyes:  Negative for pain and discharge.  Respiratory:  Negative for cough and shortness of breath.   Cardiovascular:  Negative for chest pain and palpitations.  Gastrointestinal:  Negative for abdominal pain, constipation, diarrhea, nausea and vomiting.  Endocrine: Negative for polydipsia and polyuria.  Genitourinary:  Negative for dysuria and hematuria.  Musculoskeletal:  Negative for neck pain and neck stiffness.  Skin:  Negative for rash.  Neurological:  Negative for dizziness, seizures, syncope and weakness.  Psychiatric/Behavioral:  Negative for agitation and behavioral problems.       Objective:    Physical Exam Vitals reviewed.  Constitutional:      General: She is not in acute distress.    Appearance: She is not diaphoretic.  HENT:     Head: Normocephalic and atraumatic.     Nose: Nose normal.     Mouth/Throat:     Mouth: Mucous membranes are moist.  Eyes:     General: No scleral icterus.    Extraocular Movements: Extraocular movements intact.  Neck:     Vascular: No carotid bruit.  Cardiovascular:     Rate and Rhythm: Normal rate and regular rhythm.     Pulses: Normal pulses.     Heart sounds: Normal heart sounds. No murmur heard.   No friction rub. No gallop.  Pulmonary:     Breath sounds: Normal breath sounds. No wheezing or rales.  Abdominal:     Palpations: Abdomen is soft.     Tenderness: There is no abdominal tenderness.  Musculoskeletal:     Cervical back: Neck supple. No tenderness.     Right lower leg: No edema.     Left lower leg: No edema.  Skin:    General: Skin is warm.     Findings: No rash.  Neurological:     General: No focal deficit present.     Mental Status: She is alert and oriented to person, place, and time.     Sensory: No sensory deficit.     Motor: No weakness.  Psychiatric:        Mood and Affect: Mood normal.        Behavior: Behavior normal.    BP 130/72 (BP Location: Right Arm, Patient Position: Sitting, Cuff Size: Normal)   Pulse (!) 110   Temp 98.3 F (36.8 C) (Oral)   Resp 18   Ht '5\' 7"'  (1.702 m)   Wt 145 lb 1.9 oz (65.8 kg)   SpO2 96%   BMI 22.73 kg/m  Wt Readings from Last 3 Encounters:  03/23/21 145 lb 1.9 oz (65.8 kg)  09/15/20 148 lb 6.4 oz (67.3 kg)  06/17/20 148 lb (67.1 kg)     Health Maintenance Due  Topic Date Due   Hepatitis C Screening  Never done   TETANUS/TDAP  Never done   COLONOSCOPY (Pts 45-90yr Insurance coverage will need to be confirmed)  Never done   MAMMOGRAM  01/05/2018   PAP SMEAR-Modifier  01/06/2019   DEXA SCAN  Never done    There are no preventive care reminders  to display for this patient.  Lab Results  Component Value Date   TSH 2.35 12/18/2018   Lab Results  Component Value Date   WBC  11.9 12/18/2018   HGB 13.3 12/18/2018   HCT 41 12/18/2018   MCV 100.8 (H) 03/08/2017   PLT 272 03/08/2017   Lab Results  Component Value Date   NA 134 (A) 12/18/2018   K 4.5 12/18/2018   CO2 22 03/08/2017   GLUCOSE 94 03/08/2017   BUN 16 12/18/2018   CREATININE 1.1 12/18/2018   BILITOT 0.6 03/08/2017   AST 23 03/08/2017   ALT 17 03/08/2017   PROT 7.3 03/08/2017   CALCIUM 9.5 03/08/2017   Lab Results  Component Value Date   CHOL 183 12/18/2018   Lab Results  Component Value Date   HDL 51 12/18/2018   Lab Results  Component Value Date   LDLCALC 92 12/18/2018   Lab Results  Component Value Date   TRIG 202 (A) 12/18/2018   Lab Results  Component Value Date   CHOLHDL 3.3 03/08/2017   Lab Results  Component Value Date   HGBA1C 5.2 12/18/2018      Assessment & Plan:   Problem List Items Addressed This Visit       Cardiovascular and Mediastinum   Essential hypertension - Primary    BP Readings from Last 1 Encounters:  03/23/21 130/72  Well-controlled with Losartan-HCTZ 100-25 mg QD Has been stressed recently regarding her husband's health (home hospice) Counseled for compliance with the medications Advised DASH diet and moderate exercise/walking, at least 150 mins/week       Aortic atherosclerosis ([facility])    Lipid profile reviewed Continue Crestor 10 mg QD        Endocrine   Hyperparathyroidism, secondary ([place])    Likely due to CKD Check vitamin D and UA        Genitourinary   Chronic kidney disease, stage 3b ([place])    Last BMP reviewed Advised to increase water intake Will check BMP after 3 months, if persistent elevated Cr. with low GFR, will refer to Nephrology - she denies referral for now Started Farxiga 5 mg daily      Relevant Medications   dapagliflozin propanediol (FARXIGA) 5 MG TABS tablet   Other  Relevant Orders   CMP14+EGFR   Vitamin D (25 hydroxy)   Urinalysis     Other   Mixed hyperlipidemia    Lipid profile reviewed, LDL - 119 Started Crestor 10 mg QD in light of HTN and aortic atherosclerosis      Other Visit Diagnoses        Encounter for screening for HIV       Relevant Orders   HIV antibody (with reflex)   Need for hepatitis C screening test       Relevant Orders   Hepatitis C Antibody       Meds ordered this encounter  Medications   dapagliflozin propanediol (FARXIGA) 5 MG TABS tablet    Sig: Take 1 tablet (5 mg total) by mouth daily before breakfast.    Dispense:  30 tablet    Refill:  2    Follow-up: Return in about 3 months (around 06/22/2021) for Annual physical.    Lindell Spar, MD

## 2021-03-23 NOTE — Patient Instructions (Signed)
Please start taking Farxiga as prescribed.  Please continue to take other medications as prescribed.  Take at least 64 ounces of fluid in a day.  Please continue to follow low salt diet and perform moderate exercise/walking at least 150 mins/week.  Please get fasting blood tests done before the next visit.

## 2021-03-23 NOTE — Assessment & Plan Note (Signed)
BP Readings from Last 1 Encounters:  03/23/21 130/72   Well-controlled with Losartan-HCTZ 100-25 mg QD Has been stressed recently regarding her husband's health (home hospice) Counseled for compliance with the medications Advised DASH diet and moderate exercise/walking, at least 150 mins/week

## 2021-03-23 NOTE — Assessment & Plan Note (Signed)
Lipid profile reviewed Continue Crestor 10 mg QD

## 2021-03-23 NOTE — Assessment & Plan Note (Signed)
Last BMP reviewed Advised to increase water intake Will check BMP after 3 months, if persistent elevated Cr. with low GFR, will refer to Nephrology - she denies referral for now Started Farxiga 5 mg daily

## 2021-03-23 NOTE — Assessment & Plan Note (Signed)
Lipid profile reviewed, LDL - 119 Started Crestor 10 mg QD in light of HTN and aortic atherosclerosis

## 2021-06-15 ENCOUNTER — Other Ambulatory Visit: Payer: Self-pay | Admitting: Internal Medicine

## 2021-06-15 DIAGNOSIS — E782 Mixed hyperlipidemia: Secondary | ICD-10-CM

## 2021-06-15 DIAGNOSIS — I10 Essential (primary) hypertension: Secondary | ICD-10-CM

## 2021-06-22 ENCOUNTER — Encounter: Payer: Self-pay | Admitting: Internal Medicine

## 2021-06-22 ENCOUNTER — Ambulatory Visit (INDEPENDENT_AMBULATORY_CARE_PROVIDER_SITE_OTHER): Payer: Commercial Managed Care - PPO | Admitting: Internal Medicine

## 2021-06-22 ENCOUNTER — Other Ambulatory Visit: Payer: Self-pay

## 2021-06-22 VITALS — BP 126/72 | HR 102 | Resp 16 | Ht 67.5 in | Wt 146.1 lb

## 2021-06-22 DIAGNOSIS — Z0001 Encounter for general adult medical examination with abnormal findings: Secondary | ICD-10-CM | POA: Diagnosis not present

## 2021-06-22 DIAGNOSIS — N1832 Chronic kidney disease, stage 3b: Secondary | ICD-10-CM

## 2021-06-22 DIAGNOSIS — I10 Essential (primary) hypertension: Secondary | ICD-10-CM | POA: Diagnosis not present

## 2021-06-22 DIAGNOSIS — Z720 Tobacco use: Secondary | ICD-10-CM | POA: Diagnosis not present

## 2021-06-22 DIAGNOSIS — Z23 Encounter for immunization: Secondary | ICD-10-CM

## 2021-06-22 DIAGNOSIS — E559 Vitamin D deficiency, unspecified: Secondary | ICD-10-CM

## 2021-06-22 MED ORDER — DAPAGLIFLOZIN PROPANEDIOL 5 MG PO TABS: 5.0000 mg | ORAL_TABLET | Freq: Every day | ORAL | 3 refills | Status: DC

## 2021-06-22 NOTE — Assessment & Plan Note (Addendum)
Last BMP reviewed, stable GFR at 41 Advised to increase water intake Will check BMP after 4 months, if persistent elevated Cr. with low GFR, will refer to Nephrology - she denies referral for now On Farxiga 5 mg daily

## 2021-06-22 NOTE — Patient Instructions (Signed)
Please start taking Vitamin D 5000 IU once daily.  Please continue taking medications as prescribed.  Please maintain at least 64 ounces of fluid in a day.

## 2021-06-22 NOTE — Progress Notes (Signed)
° °Established Patient Office Visit ° °Subjective:  °Patient ID: Diana Hubbard, female    DOB: 10/24/1955  Age: 65 y.o. MRN: [id number] ° °CC:  °Chief Complaint  °Patient presents with  ° Annual Exam  ° ° °HPI °Diana Hubbard is a 65 y.o. female with past medical history of HTN, CKD and HLD who  presents for annual physical. ° °HTN: BP is well-controlled. Takes medications regularly. Patient denies headache, dizziness, chest pain, dyspnea or palpitations. ° °HLD: On Crestor. Lipid profile reviewed. °  °CKD: She states that she has poor PO intake of fluids. She prefers to increase water intake for now and if persistent elevated kidney function tests, she will see a Nephrologist. Denies urinary hesitancy, dysuria or hematuria. She has started Farxiga now. ° °She has not sent cologuard kit yet. ° °She received PCV20 in the office today. ° °Past Medical History:  °Diagnosis Date  ° Abnormal Pap smear of cervix 01/12/2016  ° LSIL will get colpo  ° COPD (chronic obstructive pulmonary disease) (HCC)   ° Hypertension   ° Mixed hyperlipidemia 06/17/2020  ° Rectocele 01/06/2016  ° Stage 3a chronic kidney disease (HCC) 06/17/2020  ° Vaginal Pap smear, abnormal   ° ° °Past Surgical History:  °Procedure Laterality Date  ° APPENDECTOMY    ° CHOLECYSTECTOMY    ° TUBAL LIGATION    ° ° °Family History  °Problem Relation Age of Onset  ° Diabetes Mother   ° Hypertension Mother   ° Hyperlipidemia Mother   ° Other Father   °     abd aneursym  ° Hyperlipidemia Father   ° Hypertension Father   ° Hemophilia Father   ° Cancer Maternal Grandmother   °     uterine  ° ° °Social History  ° °Socioeconomic History  ° Marital status: Married  °  Spouse name: Mark  ° Number of children: 2  ° Years of education: 13  ° Highest education level: Not on file  °Occupational History  ° Occupation: accounting  °  Employer: [employer]  °  Comment: [employer]  °Tobacco Use  ° Smoking status: Every Day  °  Packs/day: 0.75  °  Years: 40.00  °  Pack years:  30.00  °  Types: Cigarettes  ° Smokeless tobacco: Never  °Substance and Sexual Activity  ° Alcohol use: Yes  °  Comment: social  ° Drug use: No  ° Sexual activity: Yes  °  Birth control/protection: Post-menopausal, Surgical  °  Comment: tubal  °Other Topics Concern  ° Not on file  °Social History Narrative  ° Married.  Lives with Husband  ° Limited caffeine up to 2 -3 cups a day  ° No reg exercise, walk at work  ° °Social Determinants of Health  ° °Financial Resource Strain: Not on file  °Food Insecurity: Not on file  °Transportation Needs: Not on file  °Physical Activity: Not on file  °Stress: Not on file  °Social Connections: Not on file  °Intimate Partner Violence: Not on file  ° ° °Outpatient Medications Prior to Visit  °Medication Sig Dispense Refill  ° amLODipine (NORVASC) 5 MG tablet TAKE 1 TABLET(5 MG) BY MOUTH DAILY 90 tablet 0  ° losartan-hydrochlorothiazide (HYZAAR) 100-25 MG tablet TAKE 1 TABLET BY MOUTH DAILY 90 tablet 1  ° rosuvastatin (CRESTOR) 10 MG tablet TAKE 1 TABLET(10 MG) BY MOUTH DAILY 90 tablet 1  ° dapagliflozin propanediol (FARXIGA) 5 MG TABS tablet Take 1 tablet (5 mg total) by   mouth daily before breakfast. 30 tablet 2  ° °No facility-administered medications prior to visit.  ° ° °Allergies  °Allergen Reactions  ° Ace Inhibitors Cough  °  cough  ° ° °ROS °Review of Systems  °Constitutional:  Negative for chills and fever.  °HENT:  Negative for congestion, sinus pressure, sinus pain and sore throat.   °Eyes:  Negative for pain and discharge.  °Respiratory:  Negative for cough and shortness of breath.   °Cardiovascular:  Negative for chest pain and palpitations.  °Gastrointestinal:  Negative for abdominal pain, constipation, diarrhea, nausea and vomiting.  °Endocrine: Negative for polydipsia and polyuria.  °Genitourinary:  Negative for dysuria and hematuria.  °Musculoskeletal:  Negative for neck pain and neck stiffness.  °Skin:  Negative for rash.  °Neurological:  Negative for dizziness,  seizures, syncope and weakness.  °Psychiatric/Behavioral:  Negative for agitation and behavioral problems.   ° °  °Objective:  °  °Physical Exam °Vitals reviewed.  °Constitutional:   °   General: She is not in acute distress. °   Appearance: She is not diaphoretic.  °HENT:  °   Head: Normocephalic and atraumatic.  °   Nose: Nose normal.  °   Mouth/Throat:  °   Mouth: Mucous membranes are moist.  °Eyes:  °   General: No scleral icterus. °   Extraocular Movements: Extraocular movements intact.  °Neck:  °   Vascular: No carotid bruit.  °Cardiovascular:  °   Rate and Rhythm: Normal rate and regular rhythm.  °   Pulses: Normal pulses.  °   Heart sounds: Normal heart sounds. No murmur heard. °  No friction rub. No gallop.  °Pulmonary:  °   Breath sounds: Normal breath sounds. No wheezing or rales.  °Abdominal:  °   Palpations: Abdomen is soft.  °   Tenderness: There is no abdominal tenderness.  °Musculoskeletal:  °   Cervical back: Neck supple. No tenderness.  °   Right lower leg: No edema.  °   Left lower leg: No edema.  °Skin: °   General: Skin is warm.  °   Findings: No rash.  °Neurological:  °   General: No focal deficit present.  °   Mental Status: She is alert and oriented to person, place, and time.  °   Cranial Nerves: No cranial nerve deficit.  °   Sensory: No sensory deficit.  °   Motor: No weakness.  °Psychiatric:     °   Mood and Affect: Mood normal.     °   Behavior: Behavior normal.  ° ° °BP 126/72    Pulse (!) 102    Resp 16    Ht 5' 7.5" (1.715 m)    Wt 146 lb 1.9 oz (66.3 kg)    SpO2 98%    BMI 22.55 kg/m²  °Wt Readings from Last 3 Encounters:  °06/22/21 146 lb 1.9 oz (66.3 kg)  °03/23/21 145 lb 1.9 oz (65.8 kg)  °09/15/20 148 lb 6.4 oz (67.3 kg)  ° ° °Lab Results  °Component Value Date  ° TSH 2.35 12/18/2018  ° °Lab Results  °Component Value Date  ° WBC 11.9 12/18/2018  ° HGB 13.3 12/18/2018  ° HCT 41 12/18/2018  ° MCV 100.8 (H) 03/08/2017  ° PLT 272 03/08/2017  ° °Lab Results  °Component Value Date  °  NA 134 (A) 12/18/2018  ° K 4.5 12/18/2018  ° CO2 22 03/08/2017  ° GLUCOSE 94 03/08/2017  ° BUN 16   12/18/2018  ° CREATININE 1.1 12/18/2018  ° BILITOT 0.6 03/08/2017  ° AST 23 03/08/2017  ° ALT 17 03/08/2017  ° PROT 7.3 03/08/2017  ° CALCIUM 9.5 03/08/2017  ° °Lab Results  °Component Value Date  ° CHOL 183 12/18/2018  ° °Lab Results  °Component Value Date  ° HDL 51 12/18/2018  ° °Lab Results  °Component Value Date  ° LDLCALC 92 12/18/2018  ° °Lab Results  °Component Value Date  ° TRIG 202 (A) 12/18/2018  ° °Lab Results  °Component Value Date  ° CHOLHDL 3.3 03/08/2017  ° °Lab Results  °Component Value Date  ° HGBA1C 5.2 12/18/2018  ° ° °  °Assessment & Plan:  ° °Problem List Items Addressed This Visit   ° °  ° Encounter for general adult medical examination with abnormal findings - Primary  ° Physical exam as documented. °Blood tests reviewed and discussed with the patient in detail. °Wants to discuss Mammography and Dexa scan in the next visit. °Needs to send Cologuard, has kit at home. °PCV20 today. °Has had Shingrix and flu vaccines. °  °  ° ° °Cardiovascular and Mediastinum  ° Essential hypertension  °  BP Readings from Last 1 Encounters:  °06/22/21 126/72  °Well-controlled with Losartan-HCTZ 100-25 mg QD °Counseled for compliance with the medications °Advised DASH diet and moderate exercise/walking, at least 150 mins/week °  °  °  ° Genitourinary  ° Chronic kidney disease, stage 3b (HCC)  °  Last BMP reviewed °Advised to increase water intake °Will check BMP after 4 months, if persistent elevated Cr. with low GFR, will refer to Nephrology - she denies referral for now °On Farxiga 5 mg daily °  °  ° Relevant Medications  ° dapagliflozin propanediol (FARXIGA) 5 MG TABS tablet  ° Other Relevant Orders  ° Basic Metabolic Panel (BMET)  ° Parathyroid hormone, intact (no Ca)  °  ° Other  ° Tobacco abuse  °  Smokes about 0.3 pack/day ° °Asked about quitting: confirms that he/she currently smokes cigarettes °Advise to quit  smoking: Educated about QUITTING to reduce the risk of cancer, cardio and cerebrovascular disease. °Assess willingness: Unwilling to quit at this time, but is working on cutting back. °Assist with counseling and pharmacotherapy: Counseled for 5 minutes and literature provided. °Arrange for follow up: follow up in 3 months and continue to offer help. °  °  °   °   ° Vitamin D deficiency  °  Advised to take Vitamin D 5000 IU QD °  °  ° ° °Meds ordered this encounter  °Medications  ° dapagliflozin propanediol (FARXIGA) 5 MG TABS tablet  °  Sig: Take 1 tablet (5 mg total) by mouth daily before breakfast.  °  Dispense:  90 tablet  °  Refill:  3  ° ° °Follow-up: Return in about 4 months (around 10/21/2021) for HTN and CKD.  ° ° °Rutwik K Patel, MD °

## 2021-06-22 NOTE — Assessment & Plan Note (Signed)
Advised to take Vitamin D 5000 IU QD 

## 2021-06-22 NOTE — Assessment & Plan Note (Signed)
Smokes about 0.3 pack/day  Asked about quitting: confirms that he/she currently smokes cigarettes Advise to quit smoking: Educated about QUITTING to reduce the risk of cancer, cardio and cerebrovascular disease. Assess willingness: Unwilling to quit at this time, but is working on cutting back. Assist with counseling and pharmacotherapy: Counseled for 5 minutes and literature provided. Arrange for follow up: follow up in 3 months and continue to offer help. 

## 2021-06-22 NOTE — Assessment & Plan Note (Signed)
BP Readings from Last 1 Encounters:  06/22/21 126/72   Well-controlled with Losartan-HCTZ 100-25 mg QD Counseled for compliance with the medications Advised DASH diet and moderate exercise/walking, at least 150 mins/week

## 2021-06-22 NOTE — Assessment & Plan Note (Addendum)
Physical exam as documented. °Blood tests reviewed and discussed with the patient in detail. °Wants to discuss Mammography and Dexa scan in the next visit. °Needs to send Cologuard, has kit at home. °PCV20 today. °Has had Shingrix and flu vaccines. °

## 2021-06-28 ENCOUNTER — Other Ambulatory Visit: Payer: Self-pay | Admitting: Internal Medicine

## 2021-06-28 DIAGNOSIS — N1832 Chronic kidney disease, stage 3b: Secondary | ICD-10-CM

## 2021-07-17 ENCOUNTER — Other Ambulatory Visit: Payer: Self-pay | Admitting: Internal Medicine

## 2021-07-17 DIAGNOSIS — I10 Essential (primary) hypertension: Secondary | ICD-10-CM

## 2021-09-13 ENCOUNTER — Other Ambulatory Visit: Payer: Self-pay | Admitting: Internal Medicine

## 2021-09-13 DIAGNOSIS — I10 Essential (primary) hypertension: Secondary | ICD-10-CM

## 2021-10-21 ENCOUNTER — Ambulatory Visit: Payer: Commercial Managed Care - PPO | Admitting: Internal Medicine

## 2021-11-09 LAB — LIPID PANEL
Cholesterol: 118 (ref 0–200)
HDL: 49 (ref 35–70)
LDL Cholesterol: 42
Triglycerides: 162 — AB (ref 40–160)

## 2021-11-09 LAB — BASIC METABOLIC PANEL
Chloride: 101 (ref 99–108)
Creatinine: 1.5 — AB (ref <=1.1)
Glucose: 93
Potassium: 4.3 mEq/L (ref 3.5–5.1)
Sodium: 142 (ref 137–147)

## 2021-11-09 LAB — COMPREHENSIVE METABOLIC PANEL: Calcium: 9.8 (ref 8.7–10.7)

## 2021-11-17 ENCOUNTER — Ambulatory Visit (INDEPENDENT_AMBULATORY_CARE_PROVIDER_SITE_OTHER): Payer: Commercial Managed Care - PPO | Admitting: Internal Medicine

## 2021-11-17 ENCOUNTER — Encounter: Payer: Self-pay | Admitting: *Deleted

## 2021-11-17 ENCOUNTER — Encounter: Payer: Self-pay | Admitting: Internal Medicine

## 2021-11-17 VITALS — BP 124/82 | HR 88 | Resp 18 | Ht 68.0 in | Wt 151.0 lb

## 2021-11-17 DIAGNOSIS — N2581 Secondary hyperparathyroidism of renal origin: Secondary | ICD-10-CM

## 2021-11-17 DIAGNOSIS — Z1231 Encounter for screening mammogram for malignant neoplasm of breast: Secondary | ICD-10-CM

## 2021-11-17 DIAGNOSIS — Z1211 Encounter for screening for malignant neoplasm of colon: Secondary | ICD-10-CM

## 2021-11-17 DIAGNOSIS — J432 Centrilobular emphysema: Secondary | ICD-10-CM | POA: Diagnosis not present

## 2021-11-17 DIAGNOSIS — I700 Atherosclerosis of aorta: Secondary | ICD-10-CM

## 2021-11-17 DIAGNOSIS — I10 Essential (primary) hypertension: Secondary | ICD-10-CM

## 2021-11-17 DIAGNOSIS — N1832 Chronic kidney disease, stage 3b: Secondary | ICD-10-CM

## 2021-11-17 DIAGNOSIS — Z720 Tobacco use: Secondary | ICD-10-CM

## 2021-11-17 DIAGNOSIS — Z780 Asymptomatic menopausal state: Secondary | ICD-10-CM | POA: Diagnosis not present

## 2021-11-17 DIAGNOSIS — E782 Mixed hyperlipidemia: Secondary | ICD-10-CM

## 2021-11-17 MED ORDER — ATORVASTATIN CALCIUM 10 MG PO TABS: 10.0000 mg | ORAL_TABLET | Freq: Every day | ORAL | 3 refills | Status: DC

## 2021-11-17 NOTE — Progress Notes (Signed)
Established Patient Office Visit  Subjective:  Patient ID: Diana Hubbard, female    DOB: 12/09/1955  Age: 66 y.o. MRN: [id number]  CC:  Chief Complaint  Patient presents with   Follow-up    4 month follow up HTN and CKD     HPI Diana Hubbard is a 66 y.o. female with past medical history of HTN, CKD and HLD who presents for f/u of her chronic medical conditions.  HTN: BP is well-controlled. Takes medications regularly. Patient denies headache, dizziness, chest pain, dyspnea or palpitations.   HLD: On Crestor. Lipid profile reviewed.   CKD: She states that she has poor PO intake of fluids. She prefers to increase water intake for now and if persistent elevated kidney function tests, she will see a Nephrologist. Denies urinary hesitancy, dysuria or hematuria. She has been taking [place] now.     Past Medical History:  Diagnosis Date   Abnormal Pap smear of cervix 01/12/2016   LSIL will get colpo   COPD (chronic obstructive pulmonary disease) (HCC)    Hypertension    Mixed hyperlipidemia 06/17/2020   Rectocele 01/06/2016   Stage 3a chronic kidney disease ([place]) 06/17/2020   Vaginal Pap smear, abnormal     Past Surgical History:  Procedure Laterality Date   APPENDECTOMY     CHOLECYSTECTOMY     TUBAL LIGATION      Family History  Problem Relation Age of Onset   Diabetes Mother    Hypertension Mother    Hyperlipidemia Mother    Other Father        abd aneursym   Hyperlipidemia Father    Hypertension Father    Hemophilia Father    Cancer Maternal Grandmother        uterine    Social History   Socioeconomic History   Marital status: Married    Spouse name: Elta [place]   Number of children: 2   Years of education: 13   Highest education level: Not on file  Occupational History   Occupation: Product/process development scientist: [employer]    Comment: [employer]  Tobacco Use   Smoking status: Every Day    Packs/day: 0.75    Years: 40.00    Pack years: 30.00    Types:  Cigarettes   Smokeless tobacco: Never  Substance and Sexual Activity   Alcohol use: Yes    Comment: social   Drug use: No   Sexual activity: Yes    Birth control/protection: Post-menopausal, Surgical    Comment: tubal  Other Topics Concern   Not on file  Social History Narrative   Married.  Lives with Husband   Limited caffeine up to 2 -3 cups a day   No reg exercise, walk at work   Social Determinants of Radio broadcast assistant Strain: Not on file  Food Insecurity: Not on file  Transportation Needs: Not on file  Physical Activity: Not on file  Stress: Not on file  Social Connections: Not on file  Intimate Partner Violence: Not on file    Outpatient Medications Prior to Visit  Medication Sig Dispense Refill   amLODipine (NORVASC) 5 MG tablet TAKE 1 TABLET(5 MG) BY MOUTH DAILY 90 tablet 0   FARXIGA 5 MG TABS tablet TAKE 1 TABLET(5 MG) BY MOUTH DAILY BEFORE AND BREAKFAST 30 tablet 2   losartan-hydrochlorothiazide (HYZAAR) 100-25 MG tablet TAKE 1 TABLET BY MOUTH DAILY 90 tablet 1   rosuvastatin (CRESTOR) 10 MG tablet TAKE 1 TABLET(10 MG)  BY MOUTH DAILY 90 tablet 1   No facility-administered medications prior to visit.    Allergies  Allergen Reactions   Ace Inhibitors Cough    cough    ROS Review of Systems  Constitutional:  Negative for chills and fever.  HENT:  Negative for congestion, sinus pressure, sinus pain and sore throat.   Eyes:  Negative for pain and discharge.  Respiratory:  Negative for cough and shortness of breath.   Cardiovascular:  Negative for chest pain and palpitations.  Gastrointestinal:  Negative for abdominal pain, constipation, diarrhea, nausea and vomiting.  Endocrine: Negative for polydipsia and polyuria.  Genitourinary:  Negative for dysuria and hematuria.  Musculoskeletal:  Negative for neck pain and neck stiffness.  Skin:  Negative for rash.  Neurological:  Negative for dizziness, seizures, syncope and weakness.   Psychiatric/Behavioral:  Negative for agitation and behavioral problems.      Objective:    Physical Exam Vitals reviewed.  Constitutional:      General: She is not in acute distress.    Appearance: She is not diaphoretic.  HENT:     Head: Normocephalic and atraumatic.     Nose: Nose normal.     Mouth/Throat:     Mouth: Mucous membranes are moist.  Eyes:     General: No scleral icterus.    Extraocular Movements: Extraocular movements intact.  Neck:     Vascular: No carotid bruit.  Cardiovascular:     Rate and Rhythm: Normal rate and regular rhythm.     Pulses: Normal pulses.     Heart sounds: Normal heart sounds. No murmur heard.   No friction rub. No gallop.  Pulmonary:     Breath sounds: Normal breath sounds. No wheezing or rales.  Musculoskeletal:     Cervical back: Neck supple. No tenderness.     Right lower leg: No edema.     Left lower leg: No edema.  Skin:    General: Skin is warm.     Findings: No rash.  Neurological:     General: No focal deficit present.     Mental Status: She is alert and oriented to person, place, and time.     Cranial Nerves: No cranial nerve deficit.     Sensory: No sensory deficit.     Motor: No weakness.  Psychiatric:        Mood and Affect: Mood normal.        Behavior: Behavior normal.    BP 124/82 (BP Location: Left Arm, Patient Position: Sitting, Cuff Size: Normal)   Pulse 88   Resp 18   Ht '5\' 8"'$  (1.727 m)   Wt 151 lb (68.5 kg)   SpO2 99%   BMI 22.96 kg/m  Wt Readings from Last 3 Encounters:  11/17/21 151 lb (68.5 kg)  06/22/21 146 lb 1.9 oz (66.3 kg)  03/23/21 145 lb 1.9 oz (65.8 kg)    Lab Results  Component Value Date   TSH 2.35 12/18/2018   Lab Results  Component Value Date   WBC 11.9 12/18/2018   HGB 13.3 12/18/2018   HCT 41 12/18/2018   MCV 100.8 (H) 03/08/2017   PLT 272 03/08/2017   Lab Results  Component Value Date   NA 142 11/09/2021   K 4.3 11/09/2021   CO2 22 03/08/2017   GLUCOSE 94  03/08/2017   BUN 16 12/18/2018   CREATININE 1.5 (A) 11/09/2021   BILITOT 0.6 03/08/2017   AST 23 03/08/2017   ALT 17 03/08/2017  PROT 7.3 03/08/2017   CALCIUM 9.8 11/09/2021   Lab Results  Component Value Date   CHOL 118 11/09/2021   Lab Results  Component Value Date   HDL 49 11/09/2021   Lab Results  Component Value Date   LDLCALC 42 11/09/2021   Lab Results  Component Value Date   TRIG 162 (A) 11/09/2021   Lab Results  Component Value Date   CHOLHDL 3.3 03/08/2017   Lab Results  Component Value Date   HGBA1C 5.2 12/18/2018      Assessment & Plan:   Problem List Items Addressed This Visit       Cardiovascular and Mediastinum   Essential hypertension    BP Readings from Last 1 Encounters:  11/17/21 124/82  Well-controlled with Losartan-HCTZ 100-25 mg QD Counseled for compliance with the medications Advised DASH diet and moderate exercise/walking, at least 150 mins/week      Relevant Medications   atorvastatin (LIPITOR) 10 MG tablet   Aortic atherosclerosis (HCC)    Lipid profile reviewed Switched from Crestor 10 mg QD to Lipitor 10 mg daily as she has CKD       Relevant Medications   atorvastatin (LIPITOR) 10 MG tablet     Respiratory   Centrilobular emphysema (HCC) - Primary    Noticed on low dose CT chest done for lung cancer screening No active complaints currently Counseled to quit smoking         Endocrine   Hyperparathyroidism, secondary ([place])    Likely due to CKD PTH - 85 On Vitamin D supplement, Ca wnl         Genitourinary   Chronic kidney disease, stage 3b ([place])    Last BMP reviewed, stable GFR at 39 Advised to increase water intake Will check BMP after 4 months, if persistent elevated Cr. with low GFR, will refer to Nephrology - she denies referral for now On Farxiga 5 mg daily Check urine microalbumin/creatinine ratio       Relevant Orders   Urine Microalbumin w/creat. ratio   Basic Metabolic Panel (BMET)      Other   Tobacco abuse    Smokes about 0.3 pack/day  Asked about quitting: confirms that he/she currently smokes cigarettes Advise to quit smoking: Educated about QUITTING to reduce the risk of cancer, cardio and cerebrovascular disease. Assess willingness: Unwilling to quit at this time, but is working on cutting back. Assist with counseling and pharmacotherapy: Counseled for 5 minutes and literature provided. Arrange for follow up: follow up in 3 months and continue to offer help.      Mixed hyperlipidemia    Lipid profile reviewed Switched from Crestor 10 mg QD to Lipitor 10 mg daily as she has CKD      Relevant Medications   atorvastatin (LIPITOR) 10 MG tablet   Other Visit Diagnoses     Special screening for malignant neoplasms, colon       Relevant Orders   Cologuard   Post-menopausal       Relevant Orders   DG Bone Density   Screening mammogram for breast cancer       Relevant Orders   MM 3D SCREEN BREAST BILATERAL       Meds ordered this encounter  Medications   atorvastatin (LIPITOR) 10 MG tablet    Sig: Take 1 tablet (10 mg total) by mouth daily.    Dispense:  90 tablet    Refill:  3    Please discontinue Rosuvastatin.    Follow-up: Return  in about 4 months (around 03/20/2022) for CKD and HTN.    Lindell Spar, MD

## 2021-11-17 NOTE — Assessment & Plan Note (Signed)
Noticed on low dose CT chest done for lung cancer screening No active complaints currently Counseled to quit smoking 

## 2021-11-17 NOTE — Assessment & Plan Note (Signed)
Lipid profile reviewed Switched from Crestor 10 mg QD to Lipitor 10 mg daily as she has CKD

## 2021-11-17 NOTE — Patient Instructions (Signed)
Please start taking Atorvastatin instead of Rosuvastatin.  Please continue taking other medications as prescribed.  Please maintain at least 64 ounces of fluid intake a day.  Continue to follow low salt diet and perform moderate exercise/walking at least 150 mins/week.  Please get fasting blood test done before the next visit.

## 2021-11-17 NOTE — Assessment & Plan Note (Signed)
Smokes about 0.3 pack/day  Asked about quitting: confirms that he/she currently smokes cigarettes Advise to quit smoking: Educated about QUITTING to reduce the risk of cancer, cardio and cerebrovascular disease. Assess willingness: Unwilling to quit at this time, but is working on cutting back. Assist with counseling and pharmacotherapy: Counseled for 5 minutes and literature provided. Arrange for follow up: follow up in 3 months and continue to offer help. 

## 2021-11-17 NOTE — Assessment & Plan Note (Signed)
Likely due to CKD PTH - 85 On Vitamin D supplement, Ca wnl 

## 2021-11-17 NOTE — Assessment & Plan Note (Signed)
BP Readings from Last 1 Encounters:  11/17/21 124/82   Well-controlled with Losartan-HCTZ 100-25 mg QD Counseled for compliance with the medications Advised DASH diet and moderate exercise/walking, at least 150 mins/week

## 2021-11-17 NOTE — Assessment & Plan Note (Signed)
Last BMP reviewed, stable GFR at 39 Advised to increase water intake Will check BMP after 4 months, if persistent elevated Cr. with low GFR, will refer to Nephrology - she denies referral for now On Farxiga 5 mg daily Check urine microalbumin/creatinine ratio

## 2021-11-17 NOTE — Assessment & Plan Note (Addendum)
Lipid profile reviewed Switched from Crestor 10 mg QD to Lipitor 10 mg daily as she has CKD

## 2021-12-08 ENCOUNTER — Other Ambulatory Visit (HOSPITAL_COMMUNITY): Payer: Commercial Managed Care - PPO

## 2021-12-08 ENCOUNTER — Ambulatory Visit (HOSPITAL_COMMUNITY): Payer: Commercial Managed Care - PPO

## 2021-12-10 ENCOUNTER — Other Ambulatory Visit: Payer: Self-pay | Admitting: Internal Medicine

## 2021-12-10 DIAGNOSIS — E782 Mixed hyperlipidemia: Secondary | ICD-10-CM

## 2021-12-10 DIAGNOSIS — I10 Essential (primary) hypertension: Secondary | ICD-10-CM

## 2021-12-15 ENCOUNTER — Ambulatory Visit (HOSPITAL_COMMUNITY)
Admission: RE | Admit: 2021-12-15 | Discharge: 2021-12-15 | Disposition: A | Discharge status: Home / Self Care | Payer: Commercial Managed Care - PPO | Source: Ambulatory Visit | Attending: Internal Medicine | Admitting: Internal Medicine

## 2021-12-15 DIAGNOSIS — Z780 Asymptomatic menopausal state: Secondary | ICD-10-CM | POA: Insufficient documentation

## 2021-12-15 DIAGNOSIS — Z1231 Encounter for screening mammogram for malignant neoplasm of breast: Secondary | ICD-10-CM

## 2021-12-22 ENCOUNTER — Other Ambulatory Visit: Payer: Self-pay | Admitting: Internal Medicine

## 2021-12-22 ENCOUNTER — Other Ambulatory Visit: Payer: Self-pay | Admitting: *Deleted

## 2021-12-22 ENCOUNTER — Encounter: Payer: Self-pay | Admitting: Internal Medicine

## 2021-12-22 ENCOUNTER — Encounter: Payer: Self-pay | Admitting: *Deleted

## 2021-12-22 ENCOUNTER — Telehealth: Payer: Self-pay | Admitting: Internal Medicine

## 2021-12-22 DIAGNOSIS — M810 Age-related osteoporosis without current pathological fracture: Secondary | ICD-10-CM

## 2021-12-22 DIAGNOSIS — N1832 Chronic kidney disease, stage 3b: Secondary | ICD-10-CM

## 2021-12-22 HISTORY — DX: Age-related osteoporosis without current pathological fracture: M81.0

## 2021-12-22 MED ORDER — DAPAGLIFLOZIN PROPANEDIOL 5 MG PO TABS: ORAL_TABLET | ORAL | 2 refills | Status: DC

## 2021-12-22 MED ORDER — ALENDRONATE SODIUM 70 MG PO TABS: 70.0000 mg | ORAL_TABLET | ORAL | 11 refills | Status: DC

## 2021-12-22 NOTE — Telephone Encounter (Signed)
Pt is in agreeable with fosamax can sent to pharmacy advised with verbal understanding

## 2021-12-22 NOTE — Telephone Encounter (Signed)
Pt calling for lab results. Please call at [phone] ext [phone]

## 2022-01-13 ENCOUNTER — Other Ambulatory Visit: Payer: Self-pay | Admitting: Internal Medicine

## 2022-01-13 DIAGNOSIS — I10 Essential (primary) hypertension: Secondary | ICD-10-CM

## 2022-01-27 ENCOUNTER — Other Ambulatory Visit: Payer: Self-pay | Admitting: *Deleted

## 2022-01-27 DIAGNOSIS — R195 Other fecal abnormalities: Secondary | ICD-10-CM

## 2022-01-27 LAB — COLOGUARD: COLOGUARD: POSITIVE — AB

## 2022-01-30 ENCOUNTER — Encounter: Payer: Self-pay | Admitting: *Deleted

## 2022-02-06 ENCOUNTER — Telehealth: Payer: Self-pay | Admitting: Internal Medicine

## 2022-02-06 NOTE — Telephone Encounter (Signed)
Patient advised with verbal understanding cologuard was positive

## 2022-02-06 NOTE — Telephone Encounter (Signed)
LVM for pt to call the office.

## 2022-02-06 NOTE — Telephone Encounter (Signed)
Pt called wanting to speak to nurse in regards to the missed call from last week about cologuard & referral, discuss questions on a medication??

## 2022-03-01 ENCOUNTER — Ambulatory Visit (INDEPENDENT_AMBULATORY_CARE_PROVIDER_SITE_OTHER): Payer: Commercial Managed Care - PPO | Admitting: Internal Medicine

## 2022-03-01 ENCOUNTER — Telehealth: Payer: Self-pay | Admitting: *Deleted

## 2022-03-01 ENCOUNTER — Encounter: Payer: Self-pay | Admitting: Internal Medicine

## 2022-03-01 VITALS — BP 143/85 | HR 109 | Temp 97.6°F | Ht 68.0 in | Wt 151.3 lb

## 2022-03-01 DIAGNOSIS — R12 Heartburn: Secondary | ICD-10-CM

## 2022-03-01 DIAGNOSIS — R195 Other fecal abnormalities: Secondary | ICD-10-CM | POA: Diagnosis not present

## 2022-03-01 NOTE — Progress Notes (Signed)
Primary Care Physician:  Lindell Spar, MD Primary Gastroenterologist:  Dr. Abbey Chatters  Chief Complaint  Patient presents with   positive cologuard    New patient. Referred by pcp Dr. Posey Pronto for positive cologuard. Patient is wanting to set up colonoscopy. No other concerns today.     HPI:   Diana Hubbard is a 66 y.o. female who presents to clinic today by referral from her PCP Dr. Posey Pronto for evaluation.  Recently had Cologuard testing performed which was positive.  No previous colonoscopy.  Denies any family history of colon malignancy.  No melena hematochezia.  No abdominal pain.  No unintentional weight loss.  Does have occasional heartburn depending on what she eats.  Symptoms are mild, intermittent.  Does not take anything chronically for acid reflux.  No dysphagia odynophagia.  No epigastric or chest pain.  Past Medical History:  Diagnosis Date   Abnormal Pap smear of cervix 01/12/2016   LSIL will get colpo   Age-related osteoporosis without current pathological fracture 12/22/2021   COPD (chronic obstructive pulmonary disease) (HCC)    Hypertension    Mixed hyperlipidemia 06/17/2020   Rectocele 01/06/2016   Stage 3a chronic kidney disease ([place]) 06/17/2020   Vaginal Pap smear, abnormal     Past Surgical History:  Procedure Laterality Date   APPENDECTOMY     CHOLECYSTECTOMY     TUBAL LIGATION      Current Outpatient Medications  Medication Sig Dispense Refill   amLODipine (NORVASC) 5 MG tablet TAKE 1 TABLET(5 MG) BY MOUTH DAILY 90 tablet 0   atorvastatin (LIPITOR) 10 MG tablet Take 1 tablet (10 mg total) by mouth daily. 90 tablet 3   dapagliflozin propanediol (FARXIGA) 5 MG TABS tablet TAKE 1 TABLET(5 MG) BY MOUTH DAILY BEFORE AND BREAKFAST 30 tablet 2   losartan-hydrochlorothiazide (HYZAAR) 100-25 MG tablet TAKE 1 TABLET BY MOUTH DAILY 90 tablet 1   alendronate (FOSAMAX) 70 MG tablet Take 1 tablet (70 mg total) by mouth every 7 (seven) days. Take with a full glass of  water on an empty stomach. (Patient not taking: Reported on 03/01/2022) 4 tablet 11   No current facility-administered medications for this visit.    Allergies as of 03/01/2022 - Review Complete 03/01/2022  Allergen Reaction Noted   Ace inhibitors Cough 03/22/2016    Family History  Problem Relation Age of Onset   Diabetes Mother    Hypertension Mother    Hyperlipidemia Mother    Other Father        abd aneursym   Hyperlipidemia Father    Hypertension Father    Hemophilia Father    Cancer Maternal Grandmother        uterine    Social History   Socioeconomic History   Marital status: Married    Spouse name: Elta Guadeloupe   Number of children: 2   Years of education: 13   Highest education level: Not on file  Occupational History   Occupation: Product/process development scientist: Engineer, water    Comment: [employer]  Tobacco Use   Smoking status: Every Day    Packs/day: 0.75    Years: 40.00    Total pack years: 30.00    Types: Cigarettes    Passive exposure: Current   Smokeless tobacco: Never  Substance and Sexual Activity   Alcohol use: Yes    Comment: social   Drug use: No   Sexual activity: Yes    Birth control/protection: Post-menopausal, Surgical    Comment: tubal  Other Topics Concern   Not on file  Social History Narrative   Married.  Lives with Husband   Limited caffeine up to 2 -3 cups a day   No reg exercise, walk at work   Social Determinants of Radio broadcast assistant Strain: Not on file  Food Insecurity: Not on file  Transportation Needs: Not on file  Physical Activity: Not on file  Stress: Not on file  Social Connections: Not on file  Intimate Partner Violence: Not on file    Subjective: Review of Systems  Constitutional:  Negative for chills and fever.  HENT:  Negative for congestion and hearing loss.   Eyes:  Negative for blurred vision and double vision.  Respiratory:  Negative for cough and shortness of breath.   Cardiovascular:  Negative for chest  pain and palpitations.  Gastrointestinal:  Positive for heartburn. Negative for abdominal pain, blood in stool, constipation, diarrhea, melena and vomiting.  Genitourinary:  Negative for dysuria and urgency.  Musculoskeletal:  Negative for joint pain and myalgias.  Skin:  Negative for itching and rash.  Neurological:  Negative for dizziness and headaches.  Psychiatric/Behavioral:  Negative for depression. The patient is not nervous/anxious.        Objective: BP (!) 143/85 (BP Location: Left Arm, Patient Position: Sitting, Cuff Size: Normal)   Pulse (!) 109   Temp 97.6 F (36.4 C) (Oral)   Ht '5\' 8"'$  (1.727 m)   Wt 151 lb 4.8 oz (68.6 kg)   BMI 23.01 kg/m  Physical Exam Constitutional:      Appearance: Normal appearance.  HENT:     Head: Normocephalic and atraumatic.  Eyes:     Extraocular Movements: Extraocular movements intact.     Conjunctiva/sclera: Conjunctivae normal.  Cardiovascular:     Rate and Rhythm: Normal rate and regular rhythm.  Pulmonary:     Effort: Pulmonary effort is normal.     Breath sounds: Normal breath sounds.  Abdominal:     General: Bowel sounds are normal.     Palpations: Abdomen is soft.  Musculoskeletal:        General: No swelling. Normal range of motion.     Cervical back: Normal range of motion and neck supple.  Skin:    General: Skin is warm and dry.     Coloration: Skin is not jaundiced.  Neurological:     General: No focal deficit present.     Mental Status: She is alert and oriented to person, place, and time.  Psychiatric:        Mood and Affect: Mood normal.        Behavior: Behavior normal.      Assessment: *Positive cologuard test *GERD, mild intermittent  Plan: Will schedule for screening colonoscopy.The risks including infection, bleed, or perforation as well as benefits, limitations, alternatives and imponderables have been reviewed with the patient. Questions have been answered. All parties agreeable.  GERD symptoms  mild, intermittent depending on diet.  We will print information in regards to decreasing reflux symptoms at home.  Continue to monitor.  No alarm symptoms today to warrant further investigation with EGD.  Thank you Dr. Posey Pronto for the kind referral.  03/01/2022 8:35 AM   Disclaimer: This note was dictated with voice recognition software. Similar sounding words can inadvertently be transcribed and may not be corrected upon review.

## 2022-03-01 NOTE — H&P (View-Only) (Signed)
Primary Care Physician:  Lindell Spar, MD Primary Gastroenterologist:  Dr. Abbey Chatters  Chief Complaint  Patient presents with   positive cologuard    New patient. Referred by pcp Dr. Posey Pronto for positive cologuard. Patient is wanting to set up colonoscopy. No other concerns today.     HPI:   Diana Hubbard is a 66 y.o. female who presents to clinic today by referral from her PCP Dr. Posey Pronto for evaluation.  Recently had Cologuard testing performed which was positive.  No previous colonoscopy.  Denies any family history of colon malignancy.  No melena hematochezia.  No abdominal pain.  No unintentional weight loss.  Does have occasional heartburn depending on what she eats.  Symptoms are mild, intermittent.  Does not take anything chronically for acid reflux.  No dysphagia odynophagia.  No epigastric or chest pain.  Past Medical History:  Diagnosis Date   Abnormal Pap smear of cervix 01/12/2016   LSIL will get colpo   Age-related osteoporosis without current pathological fracture 12/22/2021   COPD (chronic obstructive pulmonary disease) (HCC)    Hypertension    Mixed hyperlipidemia 06/17/2020   Rectocele 01/06/2016   Stage 3a chronic kidney disease ([place]) 06/17/2020   Vaginal Pap smear, abnormal     Past Surgical History:  Procedure Laterality Date   APPENDECTOMY     CHOLECYSTECTOMY     TUBAL LIGATION      Current Outpatient Medications  Medication Sig Dispense Refill   amLODipine (NORVASC) 5 MG tablet TAKE 1 TABLET(5 MG) BY MOUTH DAILY 90 tablet 0   atorvastatin (LIPITOR) 10 MG tablet Take 1 tablet (10 mg total) by mouth daily. 90 tablet 3   dapagliflozin propanediol (FARXIGA) 5 MG TABS tablet TAKE 1 TABLET(5 MG) BY MOUTH DAILY BEFORE AND BREAKFAST 30 tablet 2   losartan-hydrochlorothiazide (HYZAAR) 100-25 MG tablet TAKE 1 TABLET BY MOUTH DAILY 90 tablet 1   alendronate (FOSAMAX) 70 MG tablet Take 1 tablet (70 mg total) by mouth every 7 (seven) days. Take with a full glass of  water on an empty stomach. (Patient not taking: Reported on 03/01/2022) 4 tablet 11   No current facility-administered medications for this visit.    Allergies as of 03/01/2022 - Review Complete 03/01/2022  Allergen Reaction Noted   Ace inhibitors Cough 03/22/2016    Family History  Problem Relation Age of Onset   Diabetes Mother    Hypertension Mother    Hyperlipidemia Mother    Other Father        abd aneursym   Hyperlipidemia Father    Hypertension Father    Hemophilia Father    Cancer Maternal Grandmother        uterine    Social History   Socioeconomic History   Marital status: Married    Spouse name: Elta Guadeloupe   Number of children: 2   Years of education: 13   Highest education level: Not on file  Occupational History   Occupation: Product/process development scientist: Engineer, water    Comment: [employer]  Tobacco Use   Smoking status: Every Day    Packs/day: 0.75    Years: 40.00    Total pack years: 30.00    Types: Cigarettes    Passive exposure: Current   Smokeless tobacco: Never  Substance and Sexual Activity   Alcohol use: Yes    Comment: social   Drug use: No   Sexual activity: Yes    Birth control/protection: Post-menopausal, Surgical    Comment: tubal  Other Topics Concern   Not on file  Social History Narrative   Married.  Lives with Husband   Limited caffeine up to 2 -3 cups a day   No reg exercise, walk at work   Social Determinants of Radio broadcast assistant Strain: Not on file  Food Insecurity: Not on file  Transportation Needs: Not on file  Physical Activity: Not on file  Stress: Not on file  Social Connections: Not on file  Intimate Partner Violence: Not on file    Subjective: Review of Systems  Constitutional:  Negative for chills and fever.  HENT:  Negative for congestion and hearing loss.   Eyes:  Negative for blurred vision and double vision.  Respiratory:  Negative for cough and shortness of breath.   Cardiovascular:  Negative for chest  pain and palpitations.  Gastrointestinal:  Positive for heartburn. Negative for abdominal pain, blood in stool, constipation, diarrhea, melena and vomiting.  Genitourinary:  Negative for dysuria and urgency.  Musculoskeletal:  Negative for joint pain and myalgias.  Skin:  Negative for itching and rash.  Neurological:  Negative for dizziness and headaches.  Psychiatric/Behavioral:  Negative for depression. The patient is not nervous/anxious.        Objective: BP (!) 143/85 (BP Location: Left Arm, Patient Position: Sitting, Cuff Size: Normal)   Pulse (!) 109   Temp 97.6 F (36.4 C) (Oral)   Ht '5\' 8"'$  (1.727 m)   Wt 151 lb 4.8 oz (68.6 kg)   BMI 23.01 kg/m  Physical Exam Constitutional:      Appearance: Normal appearance.  HENT:     Head: Normocephalic and atraumatic.  Eyes:     Extraocular Movements: Extraocular movements intact.     Conjunctiva/sclera: Conjunctivae normal.  Cardiovascular:     Rate and Rhythm: Normal rate and regular rhythm.  Pulmonary:     Effort: Pulmonary effort is normal.     Breath sounds: Normal breath sounds.  Abdominal:     General: Bowel sounds are normal.     Palpations: Abdomen is soft.  Musculoskeletal:        General: No swelling. Normal range of motion.     Cervical back: Normal range of motion and neck supple.  Skin:    General: Skin is warm and dry.     Coloration: Skin is not jaundiced.  Neurological:     General: No focal deficit present.     Mental Status: She is alert and oriented to person, place, and time.  Psychiatric:        Mood and Affect: Mood normal.        Behavior: Behavior normal.      Assessment: *Positive cologuard test *GERD, mild intermittent  Plan: Will schedule for screening colonoscopy.The risks including infection, bleed, or perforation as well as benefits, limitations, alternatives and imponderables have been reviewed with the patient. Questions have been answered. All parties agreeable.  GERD symptoms  mild, intermittent depending on diet.  We will print information in regards to decreasing reflux symptoms at home.  Continue to monitor.  No alarm symptoms today to warrant further investigation with EGD.  Thank you Dr. Posey Pronto for the kind referral.  03/01/2022 8:35 AM   Disclaimer: This note was dictated with voice recognition software. Similar sounding words can inadvertently be transcribed and may not be corrected upon review.

## 2022-03-01 NOTE — Telephone Encounter (Signed)
LMTRC to schedule colonoscopy ASA 3 with Dr.Carver 

## 2022-03-01 NOTE — Patient Instructions (Signed)
We will schedule you for colonoscopy given your recent positive Cologuard testing.  Further recommendations to follow.  It was very nice seeing you again today.  Dr. Abbey Chatters

## 2022-03-03 NOTE — Telephone Encounter (Signed)
LMTRC

## 2022-03-03 NOTE — Telephone Encounter (Signed)
Patient returned call and states she will have to call back next week to confirm appt on 03/27/22 at 11:30 am because she needs to make sure she has transportation to and from the procedure.

## 2022-03-07 NOTE — Telephone Encounter (Signed)
LMTRC

## 2022-03-09 ENCOUNTER — Encounter: Payer: Self-pay | Admitting: *Deleted

## 2022-03-09 MED ORDER — PEG 3350-KCL-NA BICARB-NACL 420 G PO SOLR: 4000.0000 mL | Freq: Once | ORAL | 0 refills | Status: AC

## 2022-03-09 NOTE — Telephone Encounter (Signed)
Pt says she will be able to do 03/27/22 at 11:30 am. Instructions and pre-op sent to pt via Mychart.  Prep sent to the pharmacy

## 2022-03-10 ENCOUNTER — Encounter: Payer: Self-pay | Admitting: *Deleted

## 2022-03-10 ENCOUNTER — Other Ambulatory Visit: Payer: Self-pay | Admitting: Internal Medicine

## 2022-03-10 DIAGNOSIS — I10 Essential (primary) hypertension: Secondary | ICD-10-CM

## 2022-03-13 NOTE — Telephone Encounter (Signed)
PA: Aerial Case ID: [id number] [employer] [id number] Aerial Case Status: Approved

## 2022-03-18 ENCOUNTER — Other Ambulatory Visit: Payer: Self-pay | Admitting: Internal Medicine

## 2022-03-18 DIAGNOSIS — N1832 Chronic kidney disease, stage 3b: Secondary | ICD-10-CM

## 2022-03-21 ENCOUNTER — Ambulatory Visit: Payer: Commercial Managed Care - PPO | Admitting: Internal Medicine

## 2022-03-21 NOTE — Patient Instructions (Signed)
Diana Hubbard  03/21/2022     '@PREFPERIOPPHARMACY'$ @   Your procedure is scheduled on  03/27/2022.   Report to [facility] at  [place].   Call this number if you have problems the morning of surgery:  [phone]   Remember:  Follow the diet and prep instructions given to you by the office.    Take these medicines the morning of surgery with A SIP OF WATER                                                    amlodipine.     Do not wear jewelry, make-up or nail polish.  Do not wear lotions, powders, or perfumes, or deodorant.  Do not shave 48 hours prior to surgery.  Men may shave face and neck.  Do not bring valuables to the hospital.  [facility] is not responsible for any belongings or valuables.  Contacts, dentures or bridgework may not be worn into surgery.  Leave your suitcase in the car.  After surgery it may be brought to your room.  For patients admitted to the hospital, discharge time will be determined by your treatment team.  Patients discharged the day of surgery will not be allowed to drive home and must have someone with them for 24 hours.    Special instructions:   DO NOT smoke tobacco or vape for 24 hours before your procedure.  Please read over the following fact sheets that you were given. Anesthesia Post-op Instructions and Care and Recovery After Surgery      Colonoscopy, Adult, Care After The following information offers guidance on how to care for yourself after your procedure. Your health care provider may also give you more specific instructions. If you have problems or questions, contact your health care provider. What can I expect after the procedure? After the procedure, it is common to have: A small amount of blood in your stool for 24 hours after the procedure. Some gas. Mild cramping or bloating of your abdomen. Follow these instructions at home: Eating and drinking  Drink enough fluid to keep your urine pale  yellow. Follow instructions from your health care provider about eating or drinking restrictions. Resume your normal diet as told by your health care provider. Avoid heavy or fried foods that are hard to digest. Activity Rest as told by your health care provider. Avoid sitting for a long time without moving. Get up to take short walks every 1-2 hours. This is important to improve blood flow and breathing. Ask for help if you feel weak or unsteady. Return to your normal activities as told by your health care provider. Ask your health care provider what activities are safe for you. Managing cramping and bloating  Try walking around when you have cramps or feel bloated. If directed, apply heat to your abdomen as told by your health care provider. Use the heat source that your health care provider recommends, such as a moist heat pack or a heating pad. Place a towel between your skin and the heat source. Leave the heat on for 20-30 minutes. Remove the heat if your skin turns bright red. This is especially important if you are unable to feel pain, heat, or cold. You have a greater risk of getting burned. General  instructions If you were given a sedative during the procedure, it can affect you for several hours. Do not drive or operate machinery until your health care provider says that it is safe. For the first 24 hours after the procedure: Do not sign important documents. Do not drink alcohol. Do your regular daily activities at a slower pace than normal. Eat soft foods that are easy to digest. Take over-the-counter and prescription medicines only as told by your health care provider. Keep all follow-up visits. This is important. Contact a health care provider if: You have blood in your stool 2-3 days after the procedure. Get help right away if: You have more than a small spotting of blood in your stool. You have large blood clots in your stool. You have swelling of your abdomen. You have  nausea or vomiting. You have a fever. You have increasing pain in your abdomen that is not relieved with medicine. These symptoms may be an emergency. Get help right away. Call 911. Do not wait to see if the symptoms will go away. Do not drive yourself to the hospital. Summary After the procedure, it is common to have a small amount of blood in your stool. You may also have mild cramping and bloating of your abdomen. If you were given a sedative during the procedure, it can affect you for several hours. Do not drive or operate machinery until your health care provider says that it is safe. Get help right away if you have a lot of blood in your stool, nausea or vomiting, a fever, or increased pain in your abdomen. This information is not intended to replace advice given to you by your health care provider. Make sure you discuss any questions you have with your health care provider. Document Revised: 02/09/2021 Document Reviewed: 02/09/2021 Elsevier Patient Education  [place] After This sheet gives you information about how to care for yourself after your procedure. Your health care provider may also give you more specific instructions. If you have problems or questions, contact your health care provider. What can I expect after the procedure? After the procedure, it is common to have: Tiredness. Forgetfulness about what happened after the procedure. Impaired judgment for important decisions. Nausea or vomiting. Some difficulty with balance. Follow these instructions at home: For the time period you were told by your health care provider:     Rest as needed. Do not participate in activities where you could fall or become injured. Do not drive or use machinery. Do not drink alcohol. Do not take sleeping pills or medicines that cause drowsiness. Do not make important decisions or sign legal documents. Do not take care of children on your  own. Eating and drinking Follow the diet that is recommended by your health care provider. Drink enough fluid to keep your urine pale yellow. If you vomit: Drink water, juice, or soup when you can drink without vomiting. Make sure you have little or no nausea before eating solid foods. General instructions Have a responsible adult stay with you for the time you are told. It is important to have someone help care for you until you are awake and alert. Take over-the-counter and prescription medicines only as told by your health care provider. If you have sleep apnea, surgery and certain medicines can increase your risk for breathing problems. Follow instructions from your health care provider about wearing your sleep device: Anytime you are sleeping, including during daytime naps. While taking prescription  pain medicines, sleeping medicines, or medicines that make you drowsy. Avoid smoking. Keep all follow-up visits as told by your health care provider. This is important. Contact a health care provider if: You keep feeling nauseous or you keep vomiting. You feel light-headed. You are still sleepy or having trouble with balance after 24 hours. You develop a rash. You have a fever. You have redness or swelling around the IV site. Get help right away if: You have trouble breathing. You have new-onset confusion at home. Summary For several hours after your procedure, you may feel tired. You may also be forgetful and have poor judgment. Have a responsible adult stay with you for the time you are told. It is important to have someone help care for you until you are awake and alert. Rest as told. Do not drive or operate machinery. Do not drink alcohol or take sleeping pills. Get help right away if you have trouble breathing, or if you suddenly become confused. This information is not intended to replace advice given to you by your health care provider. Make sure you discuss any questions you  have with your health care provider. Document Revised: 05/24/2021 Document Reviewed: 05/22/2019 Elsevier Patient Education  [place].

## 2022-03-23 ENCOUNTER — Encounter (HOSPITAL_COMMUNITY)
Admission: RE | Admit: 2022-03-23 | Discharge: 2022-03-23 | Disposition: A | Discharge status: Home / Self Care | Payer: Commercial Managed Care - PPO | Source: Ambulatory Visit | Attending: Internal Medicine | Admitting: Internal Medicine

## 2022-03-23 VITALS — BP 156/77 | HR 91 | Temp 97.8°F | Resp 18 | Ht 68.0 in | Wt 151.2 lb

## 2022-03-23 DIAGNOSIS — I10 Essential (primary) hypertension: Secondary | ICD-10-CM | POA: Diagnosis not present

## 2022-03-23 DIAGNOSIS — Z01818 Encounter for other preprocedural examination: Secondary | ICD-10-CM | POA: Insufficient documentation

## 2022-03-23 DIAGNOSIS — N1832 Chronic kidney disease, stage 3b: Secondary | ICD-10-CM | POA: Insufficient documentation

## 2022-03-23 LAB — BASIC METABOLIC PANEL
Anion gap: 8 (ref 5–15)
BUN: 25 mg/dL — ABNORMAL HIGH (ref 8–23)
CO2: 26 mmol/L (ref 22–32)
Calcium: 8.8 mg/dL — ABNORMAL LOW (ref 8.9–10.3)
Chloride: 101 mmol/L (ref 98–111)
Creatinine, Ser: 1.42 mg/dL — ABNORMAL HIGH (ref 0.44–1.00)
GFR, Estimated: 41 mL/min — ABNORMAL LOW (ref >60)
Glucose, Bld: 96 mg/dL (ref 70–99)
Potassium: 3 mmol/L — ABNORMAL LOW (ref 3.5–5.1)
Sodium: 135 mmol/L (ref 135–145)

## 2022-03-24 ENCOUNTER — Telehealth: Payer: Self-pay | Admitting: Internal Medicine

## 2022-03-24 MED ORDER — POTASSIUM CHLORIDE CRYS ER 20 MEQ PO TBCR: 40.0000 meq | EXTENDED_RELEASE_TABLET | Freq: Two times a day (BID) | ORAL | 0 refills | Status: DC

## 2022-03-24 NOTE — Pre-Procedure Instructions (Signed)
Dr Adalberto Ill aware of potassium ans we will repeat Istat on arrival 9/25. Dr Abbey Chatters messaged back and will address potassium level.

## 2022-03-24 NOTE — Pre-Procedure Instructions (Signed)
Dr Abbey Chatters called RX in for potassium. I contacted patient and she is aware to pick that up and start that today.

## 2022-03-24 NOTE — Pre-Procedure Instructions (Signed)
Dr Isaias Sakai about 3.0 potassium.

## 2022-03-24 NOTE — Telephone Encounter (Signed)
Hi Kim, I have sent in potassium chloride 40 meq twice daily x3 days to patient's pharmacy. Please call her and let her know. Thank you

## 2022-03-25 ENCOUNTER — Other Ambulatory Visit: Payer: Self-pay | Admitting: Internal Medicine

## 2022-03-26 ENCOUNTER — Other Ambulatory Visit: Payer: Self-pay | Admitting: Internal Medicine

## 2022-03-27 ENCOUNTER — Encounter (HOSPITAL_COMMUNITY)
Admission: RE | Disposition: A | Discharge status: Home / Self Care | Payer: Self-pay | Source: Ambulatory Visit | Attending: Internal Medicine

## 2022-03-27 ENCOUNTER — Ambulatory Visit (HOSPITAL_COMMUNITY): Payer: Commercial Managed Care - PPO | Admitting: Certified Registered Nurse Anesthetist

## 2022-03-27 ENCOUNTER — Other Ambulatory Visit: Payer: Self-pay

## 2022-03-27 ENCOUNTER — Ambulatory Visit (HOSPITAL_BASED_OUTPATIENT_CLINIC_OR_DEPARTMENT_OTHER): Payer: Commercial Managed Care - PPO | Admitting: Certified Registered Nurse Anesthetist

## 2022-03-27 ENCOUNTER — Other Ambulatory Visit: Payer: Self-pay | Admitting: Internal Medicine

## 2022-03-27 ENCOUNTER — Ambulatory Visit (HOSPITAL_COMMUNITY)
Admission: RE | Admit: 2022-03-27 | Discharge: 2022-03-27 | Disposition: A | Discharge status: Home / Self Care | Payer: Commercial Managed Care - PPO | Source: Ambulatory Visit | Attending: Internal Medicine | Admitting: Internal Medicine

## 2022-03-27 ENCOUNTER — Encounter (HOSPITAL_COMMUNITY): Payer: Self-pay

## 2022-03-27 DIAGNOSIS — D123 Benign neoplasm of transverse colon: Secondary | ICD-10-CM | POA: Insufficient documentation

## 2022-03-27 DIAGNOSIS — R195 Other fecal abnormalities: Secondary | ICD-10-CM | POA: Insufficient documentation

## 2022-03-27 DIAGNOSIS — Z09 Encounter for follow-up examination after completed treatment for conditions other than malignant neoplasm: Secondary | ICD-10-CM

## 2022-03-27 DIAGNOSIS — D124 Benign neoplasm of descending colon: Secondary | ICD-10-CM | POA: Diagnosis not present

## 2022-03-27 DIAGNOSIS — K648 Other hemorrhoids: Secondary | ICD-10-CM | POA: Insufficient documentation

## 2022-03-27 DIAGNOSIS — K635 Polyp of colon: Secondary | ICD-10-CM

## 2022-03-27 DIAGNOSIS — N1831 Chronic kidney disease, stage 3a: Secondary | ICD-10-CM | POA: Diagnosis not present

## 2022-03-27 DIAGNOSIS — Z1211 Encounter for screening for malignant neoplasm of colon: Secondary | ICD-10-CM | POA: Diagnosis not present

## 2022-03-27 DIAGNOSIS — E876 Hypokalemia: Secondary | ICD-10-CM

## 2022-03-27 DIAGNOSIS — F17210 Nicotine dependence, cigarettes, uncomplicated: Secondary | ICD-10-CM | POA: Insufficient documentation

## 2022-03-27 DIAGNOSIS — K5730 Diverticulosis of large intestine without perforation or abscess without bleeding: Secondary | ICD-10-CM | POA: Insufficient documentation

## 2022-03-27 DIAGNOSIS — D125 Benign neoplasm of sigmoid colon: Secondary | ICD-10-CM | POA: Insufficient documentation

## 2022-03-27 DIAGNOSIS — Z86010 Personal history of colonic polyps: Secondary | ICD-10-CM | POA: Diagnosis not present

## 2022-03-27 DIAGNOSIS — I129 Hypertensive chronic kidney disease with stage 1 through stage 4 chronic kidney disease, or unspecified chronic kidney disease: Secondary | ICD-10-CM | POA: Insufficient documentation

## 2022-03-27 DIAGNOSIS — D122 Benign neoplasm of ascending colon: Secondary | ICD-10-CM | POA: Diagnosis not present

## 2022-03-27 HISTORY — PX: COLONOSCOPY WITH PROPOFOL: SHX5780

## 2022-03-27 HISTORY — PX: POLYPECTOMY: SHX5525

## 2022-03-27 SURGERY — COLONOSCOPY WITH PROPOFOL
Anesthesia: General

## 2022-03-27 MED ORDER — LACTATED RINGERS IV SOLN: INTRAVENOUS | Status: DC

## 2022-03-27 MED ORDER — PROPOFOL 10 MG/ML IV BOLUS
INTRAVENOUS | Status: DC | PRN
  Administered 2022-03-27 (×2): 50 mg via INTRAVENOUS
  Administered 2022-03-27: 100 mg via INTRAVENOUS
  Administered 2022-03-27: 50 mg via INTRAVENOUS
  Administered 2022-03-27: 30 mg via INTRAVENOUS
  Administered 2022-03-27 (×3): 50 mg via INTRAVENOUS

## 2022-03-27 NOTE — Discharge Instructions (Addendum)
  Colonoscopy Discharge Instructions  Read the instructions outlined below and refer to this sheet in the next few weeks. These discharge instructions provide you with general information on caring for yourself after you leave the hospital. Your doctor may also give you specific instructions. While your treatment has been planned according to the most current medical practices available, unavoidable complications occasionally occur.   ACTIVITY You may resume your regular activity, but move at a slower pace for the next 24 hours.  Take frequent rest periods for the next 24 hours.  Walking will help get rid of the air and reduce the bloated feeling in your belly (abdomen).  No driving for 24 hours (because of the medicine (anesthesia) used during the test).   Do not sign any important legal documents or operate any machinery for 24 hours (because of the anesthesia used during the test).  NUTRITION Drink plenty of fluids.  You may resume your normal diet as instructed by your doctor.  Begin with a light meal and progress to your normal diet. Heavy or fried foods are harder to digest and may make you feel sick to your stomach (nauseated).  Avoid alcoholic beverages for 24 hours or as instructed.  MEDICATIONS You may resume your normal medications unless your doctor tells you otherwise.  WHAT YOU CAN EXPECT TODAY Some feelings of bloating in the abdomen.  Passage of more gas than usual.  Spotting of blood in your stool or on the toilet paper.  IF YOU HAD POLYPS REMOVED DURING THE COLONOSCOPY: No aspirin products for 7 days or as instructed.  No alcohol for 7 days or as instructed.  Eat a soft diet for the next 24 hours.  FINDING OUT THE RESULTS OF YOUR TEST Not all test results are available during your visit. If your test results are not back during the visit, make an appointment with your caregiver to find out the results. Do not assume everything is normal if you have not heard from your  caregiver or the medical facility. It is important for you to follow up on all of your test results.  SEEK IMMEDIATE MEDICAL ATTENTION IF: You have more than a spotting of blood in your stool.  Your belly is swollen (abdominal distention).  You are nauseated or vomiting.  You have a temperature over 101.  You have abdominal pain or discomfort that is severe or gets worse throughout the day.   Your colonoscopy revealed 10 polyp(s) which I removed successfully. Await pathology results, my office will contact you. I recommend repeating colonoscopy in 1-3 years for surveillance purposes depending on pathology results.   You also have diverticulosis and internal hemorrhoids. I would recommend increasing fiber in your diet or adding OTC Benefiber/Metamucil. Be sure to drink at least 4 to 6 glasses of water daily. Follow-up with GI as needed.   I hope you have a great rest of your week!  Elon Alas. Abbey Chatters, D.O. Gastroenterology and Hepatology [facility]

## 2022-03-27 NOTE — Anesthesia Postprocedure Evaluation (Signed)
Anesthesia Post Note  Patient: [name]  Procedure(s) Performed: COLONOSCOPY WITH PROPOFOL POLYPECTOMY  Patient location during evaluation: Phase II Anesthesia Type: General Level of consciousness: awake Pain management: pain level controlled Vital Signs Assessment: post-procedure vital signs reviewed and stable Respiratory status: spontaneous breathing and respiratory function stable Cardiovascular status: blood pressure returned to baseline and stable Postop Assessment: no headache and no apparent nausea or vomiting Anesthetic complications: no Comments: Late entry   No notable events documented.   Last Vitals:  Vitals:   03/27/22 1004 03/27/22 1104  BP: (!) 145/82 109/60  Pulse: 99 93  Resp: 13 19  Temp: 36.7 C 36.8 C  SpO2: 100% 95%    Last Pain:  Vitals:   03/27/22 1104  TempSrc: Oral  PainSc: 0-No pain                 Louann Sjogren

## 2022-03-27 NOTE — Transfer of Care (Signed)
Immediate Anesthesia Transfer of Care Note  Patient: Diana Hubbard  Procedure(s) Performed: COLONOSCOPY WITH PROPOFOL POLYPECTOMY  Patient Location: Short Stay  Anesthesia Type:General  Level of Consciousness: awake, alert  and oriented  Airway & Oxygen Therapy: Patient Spontanous Breathing  Post-op Assessment: Report given to RN and Post -op Vital signs reviewed and stable  Post vital signs: Reviewed and stable  Last Vitals:  Vitals Value Taken Time  BP 109/60 03/27/22 1104  Temp 36.8 C 03/27/22 1104  Pulse 93 03/27/22 1104  Resp 19 03/27/22 1104  SpO2 95 % 03/27/22 1104    Last Pain:  Vitals:   03/27/22 1104  TempSrc: Oral  PainSc: 0-No pain      Patients Stated Pain Goal: 8 (91/22/58 3462)  Complications: No notable events documented.

## 2022-03-27 NOTE — Interval H&P Note (Signed)
History and Physical Interval Note:  03/27/2022 9:47 AM  Diana Hubbard  has presented today for surgery, with the diagnosis of positive cologuard.  The various methods of treatment have been discussed with the patient and family. After consideration of risks, benefits and other options for treatment, the patient has consented to  Procedure(s) with comments: COLONOSCOPY WITH PROPOFOL (N/A) - 11:30 am as a surgical intervention.  The patient's history has been reviewed, patient examined, no change in status, stable for surgery.  I have reviewed the patient's chart and labs.  Questions were answered to the patient's satisfaction.     Eloise Harman

## 2022-03-27 NOTE — Anesthesia Preprocedure Evaluation (Signed)
Anesthesia Evaluation  Patient identified by MRN, date of birth, ID band Patient awake    Reviewed: Allergy & Precautions, H&P , NPO status , Patient's Chart, lab work & pertinent test results, reviewed documented beta blocker date and time   Airway Mallampati: II  TM Distance: >3 FB Neck ROM: full    Dental no notable dental hx.    Pulmonary neg pulmonary ROS, Current Smoker and Patient abstained from smoking.,    Pulmonary exam normal breath sounds clear to auscultation       Cardiovascular Exercise Tolerance: Good hypertension, negative cardio ROS   Rhythm:regular Rate:Normal     Neuro/Psych negative neurological ROS  negative psych ROS   GI/Hepatic negative GI ROS, Neg liver ROS,   Endo/Other  negative endocrine ROS  Renal/GU CRFRenal disease  negative genitourinary   Musculoskeletal   Abdominal   Peds  Hematology negative hematology ROS (+)   Anesthesia Other Findings   Reproductive/Obstetrics negative OB ROS                             Anesthesia Physical Anesthesia Plan  ASA: 2  Anesthesia Plan: General   Post-op Pain Management:    Induction:   PONV Risk Score and Plan: Propofol infusion  Airway Management Planned:   Additional Equipment:   Intra-op Plan:   Post-operative Plan:   Informed Consent: I have reviewed the patients History and Physical, chart, labs and discussed the procedure including the risks, benefits and alternatives for the proposed anesthesia with the patient or authorized representative who has indicated his/her understanding and acceptance.     Dental Advisory Given  Plan Discussed with: CRNA  Anesthesia Plan Comments:         Anesthesia Quick Evaluation

## 2022-03-27 NOTE — Op Note (Signed)
[facility] Patient Name: Diana Hubbard Procedure Date: 03/27/2022 10:21 AM MRN: [id number] Date of Birth: [birth year] Attending MD: Elon Alas. Abbey Chatters DO CSN: [id number] Age: 66 Admit Type: Outpatient Procedure:                Colonoscopy Indications:              Screening for colorectal malignant neoplasm,                            Incidental - Positive Cologuard test Providers:                Elon Alas. Abbey Chatters, DO, Caprice Kluver, Ladoris Gene                            Technician, Technician, Aram Candela Referring MD:              Medicines:                See the Anesthesia note for documentation of the                            administered medications Complications:            No immediate complications. Estimated Blood Loss:     Estimated blood loss was minimal. Procedure:                Pre-Anesthesia Assessment:                           - The anesthesia plan was to use monitored                            anesthesia care (MAC).                           After obtaining informed consent, the colonoscope                            was passed under direct vision. Throughout the                            procedure, the patient's blood pressure, pulse, and                            oxygen saturations were monitored continuously. The                            [id number]-HQ190L ([id number]) scope was introduced through                            the anus and advanced to the the cecum, identified                            by appendiceal orifice and ileocecal valve. The                            colonoscopy was performed without difficulty.  The                            patient tolerated the procedure well. The quality                            of the bowel preparation was evaluated using the                            BBPS [facility] Bowel Preparation Scale) with scores                            of: Right Colon = 2 (minor amount of residual                            staining,  small fragments of stool and/or opaque                            liquid, but mucosa seen well), Transverse Colon = 3                            (entire mucosa seen well with no residual staining,                            small fragments of stool or opaque liquid) and Left                            Colon = 3 (entire mucosa seen well with no residual                            staining, small fragments of stool or opaque                            liquid). The total BBPS score equals 8. The quality                            of the bowel preparation was fair. Scope In: 10:36:10 AM Scope Out: 10:59:47 AM Scope Withdrawal Time: 0 hours 20 minutes 53 seconds  Total Procedure Duration: 0 hours 23 minutes 37 seconds  Findings:      The perianal and digital rectal examinations were normal.      Non-bleeding internal hemorrhoids were found during endoscopy.      Multiple small-mouthed diverticula were found in the sigmoid colon.      Two sessile polyps were found in the ascending colon. The polyps were 2       mm in size. These polyps were removed with a cold biopsy forceps.       Resection and retrieval were complete.      Five sessile polyps were found in the descending colon and transverse       colon. The polyps were 4 to 8 mm in size. These polyps were removed with       a cold snare. Resection and retrieval were complete.      A 12 mm polyp was found in  the sigmoid colon. The polyp was       pedunculated. The polyp was removed with a hot snare. Resection and       retrieval were complete.      Two sessile polyps were found in the sigmoid colon. The polyps were 5 to       6 mm in size. These polyps were removed with a cold snare. Resection and       retrieval were complete. Impression:               - Preparation of the colon was fair.                           - Non-bleeding internal hemorrhoids.                           - Diverticulosis in the sigmoid colon.                            - Two 2 mm polyps in the ascending colon, removed                            with a cold biopsy forceps. Resected and retrieved.                           - Five 4 to 8 mm polyps in the descending colon and                            in the transverse colon, removed with a cold snare.                            Resected and retrieved.                           - One 12 mm polyp in the sigmoid colon, removed                            with a hot snare. Resected and retrieved.                           - Two 5 to 6 mm polyps in the sigmoid colon,                            removed with a cold snare. Resected and retrieved. Moderate Sedation:      Per Anesthesia Care Recommendation:           - Patient has a contact number available for                            emergencies. The signs and symptoms of potential                            delayed complications were discussed with the  patient. Return to normal activities tomorrow.                            Written discharge instructions were provided to the                            patient.                           - Continue present medications.                           - Resume previous diet.                           - Await pathology results.                           - Repeat colonoscopy in 1-3 years for surveillance.                           - Return to GI clinic PRN. Procedure Code(s):        --- Professional ---                           [phone], Colonoscopy, flexible; with removal of                            tumor(s), polyp(s), or other lesion(s) by snare                            technique                           45380, 46, Colonoscopy, flexible; with biopsy,                            single or multiple Diagnosis Code(s):        --- Professional ---                           K63.5, Polyp of colon                           Z12.11, Encounter for screening for malignant                             neoplasm of colon                           K64.8, Other hemorrhoids                           K57.30, Diverticulosis of large intestine without                            perforation or abscess without bleeding CPT copyright 2019 American Medical Association. All rights reserved. The  codes documented in this report are preliminary and upon coder review may  be revised to meet current compliance requirements. Elon Alas. Abbey Chatters, DO [place] Abbey Chatters, DO 03/27/2022 11:03:11 AM This report has been signed electronically. Number of Addenda: 0

## 2022-03-28 LAB — POCT I-STAT, CHEM 8
BUN: 13 mg/dL (ref 8–23)
Calcium, Ion: 1.19 mmol/L (ref 1.15–1.40)
Chloride: 107 mmol/L (ref 98–111)
Creatinine, Ser: 1.3 mg/dL — ABNORMAL HIGH (ref 0.44–1.00)
Glucose, Bld: 103 mg/dL — ABNORMAL HIGH (ref 70–99)
HCT: 41 % (ref 36.0–46.0)
Hemoglobin: 13.9 g/dL (ref 12.0–15.0)
Potassium: 3.6 mmol/L (ref 3.5–5.1)
Sodium: 142 mmol/L (ref 135–145)
TCO2: 21 mmol/L — ABNORMAL LOW (ref 22–32)

## 2022-03-28 LAB — SURGICAL PATHOLOGY

## 2022-04-04 ENCOUNTER — Encounter (HOSPITAL_COMMUNITY): Payer: Self-pay | Admitting: Internal Medicine

## 2022-04-18 ENCOUNTER — Ambulatory Visit (INDEPENDENT_AMBULATORY_CARE_PROVIDER_SITE_OTHER): Payer: Commercial Managed Care - PPO | Admitting: Internal Medicine

## 2022-04-18 ENCOUNTER — Encounter: Payer: Self-pay | Admitting: Internal Medicine

## 2022-04-18 VITALS — BP 124/82 | HR 100 | Resp 18 | Ht 68.0 in | Wt 152.0 lb

## 2022-04-18 DIAGNOSIS — I10 Essential (primary) hypertension: Secondary | ICD-10-CM

## 2022-04-18 DIAGNOSIS — M810 Age-related osteoporosis without current pathological fracture: Secondary | ICD-10-CM | POA: Diagnosis not present

## 2022-04-18 DIAGNOSIS — Z0001 Encounter for general adult medical examination with abnormal findings: Secondary | ICD-10-CM | POA: Diagnosis not present

## 2022-04-18 DIAGNOSIS — N1832 Chronic kidney disease, stage 3b: Secondary | ICD-10-CM

## 2022-04-18 DIAGNOSIS — L989 Disorder of the skin and subcutaneous tissue, unspecified: Secondary | ICD-10-CM

## 2022-04-18 MED ORDER — IBANDRONATE SODIUM 150 MG PO TABS: 150.0000 mg | ORAL_TABLET | ORAL | 3 refills | Status: DC

## 2022-04-18 NOTE — Patient Instructions (Addendum)
Please take Ibandronate as prescribed for osteoporosis.  Please continue taking other medications as prescribed.  Please continue to follow low salt diet and perform moderate exercise/walking at least 150 mins/week.  Please try to cut down -> quit smoking.  Please get blood tests done before the next visit.

## 2022-04-18 NOTE — Assessment & Plan Note (Addendum)
Physical exam as documented. Blood tests reviewed and discussed with the patient in detail. Has had Pneumococcal, Shingrix and flu vaccines.

## 2022-04-18 NOTE — Progress Notes (Signed)
Established Patient Office Visit  Subjective:  Patient ID: Diana Hubbard, female    DOB: 02/17/1956  Age: 66 y.o. MRN: [id number]  CC:  Chief Complaint  Patient presents with   Annual Exam    pt has spot on left leg on ankle noticed 04/16/22    HPI Diana Hubbard is a 66 y.o. female with past medical history of HTN, CKD and HLD who presents for annual physical.  HTN: BP is well-controlled. Takes medications regularly. Patient denies headache, dizziness, chest pain, dyspnea or palpitations.   HLD: On Crestor. Lipid profile reviewed.   CKD: Her BMP showed slight improvement in GFR to 44. She states that she has poor PO intake of fluids. She has tried to increase water intake for now, but states that she still needs to improve her fluid intake and if persistent elevated kidney function tests, she will see a Nephrologist. Denies urinary hesitancy, dysuria or hematuria. She has been taking [place] now.  She has a skin lesion on left ankle since yesterday.  It appears like a insect bite.  She reports working in the yard 2 days ago.  She has history of venous ulcer in the past.   Past Medical History:  Diagnosis Date   Abnormal Pap smear of cervix 01/12/2016   LSIL will get colpo   Age-related osteoporosis without current pathological fracture 12/22/2021   COPD (chronic obstructive pulmonary disease) ([place])    Hypertension    Mixed hyperlipidemia 06/17/2020   Rectocele 01/06/2016   Stage 3a chronic kidney disease ([place]) 06/17/2020   Vaginal Pap smear, abnormal     Past Surgical History:  Procedure Laterality Date   APPENDECTOMY     CHOLECYSTECTOMY     COLONOSCOPY WITH PROPOFOL N/A 03/27/2022   Procedure: COLONOSCOPY WITH PROPOFOL;  Surgeon: Eloise Harman, DO;  Location: [facility];  Service: Endoscopy;  Laterality: N/A;  11:30 am   POLYPECTOMY  03/27/2022   Procedure: POLYPECTOMY;  Surgeon: Eloise Harman, DO;  Location: [facility];  Service: Endoscopy;;   TUBAL  LIGATION      Family History  Problem Relation Age of Onset   Diabetes Mother    Hypertension Mother    Hyperlipidemia Mother    Other Father        abd aneursym   Hyperlipidemia Father    Hypertension Father    Hemophilia Father    Cancer Maternal Grandmother        uterine    Social History   Socioeconomic History   Marital status: Widowed    Spouse name: Elta [place]   Number of children: 2   Years of education: 13   Highest education level: Not on file  Occupational History   Occupation: Product/process development scientist: Engineer, water    Comment: [employer]  Tobacco Use   Smoking status: Every Day    Packs/day: 0.75    Years: 40.00    Total pack years: 30.00    Types: Cigarettes    Passive exposure: Current   Smokeless tobacco: Never  Substance and Sexual Activity   Alcohol use: Yes    Comment: social   Drug use: No   Sexual activity: Yes    Birth control/protection: Post-menopausal, Surgical    Comment: tubal  Other Topics Concern   Not on file  Social History Narrative   Married.  Lives with Husband   Limited caffeine up to 2 -3 cups a day   No reg exercise, walk at  work   Investment banker, operational of Radio broadcast assistant Strain: Not on [employer] Insecurity: Not on file  Transportation Needs: Not on file  Physical Activity: Not on file  Stress: Not on file  Social Connections: Not on file  Intimate Partner Violence: Not on file    Outpatient Medications Prior to Visit  Medication Sig Dispense Refill   acetaminophen (TYLENOL) 500 MG tablet Take 1,000 mg by mouth 2 (two) times daily.     amLODipine (NORVASC) 5 MG tablet TAKE 1 TABLET(5 MG) BY MOUTH DAILY 90 tablet 0   atorvastatin (LIPITOR) 10 MG tablet Take 1 tablet (10 mg total) by mouth daily. 90 tablet 3   FARXIGA 5 MG TABS tablet TAKE 1 TABLET BY MOUTH EVERY DAY BEFORE BREAKFAST 30 tablet 2   losartan-hydrochlorothiazide (HYZAAR) 100-25 MG tablet TAKE 1 TABLET BY MOUTH DAILY 90 tablet 1   alendronate  (FOSAMAX) 70 MG tablet Take 1 tablet (70 mg total) by mouth every 7 (seven) days. Take with a full glass of water on an empty stomach. (Patient not taking: Reported on 04/18/2022) 4 tablet 11   No facility-administered medications prior to visit.    Allergies  Allergen Reactions   Ace Inhibitors Cough    cough    ROS Review of Systems  Constitutional:  Negative for chills and fever.  HENT:  Negative for congestion, sinus pressure, sinus pain and sore throat.   Eyes:  Negative for pain and discharge.  Respiratory:  Negative for cough and shortness of breath.   Cardiovascular:  Negative for chest pain and palpitations.  Gastrointestinal:  Negative for abdominal pain, constipation, diarrhea, nausea and vomiting.  Endocrine: Negative for polydipsia and polyuria.  Genitourinary:  Negative for dysuria and hematuria.  Musculoskeletal:  Negative for neck pain and neck stiffness.  Skin:  Negative for rash.  Neurological:  Negative for dizziness, seizures, syncope and weakness.  Psychiatric/Behavioral:  Negative for agitation and behavioral problems.       Objective:    Physical Exam Vitals reviewed.  Constitutional:      General: She is not in acute distress.    Appearance: She is not diaphoretic.  HENT:     Head: Normocephalic and atraumatic.     Nose: Nose normal.     Mouth/Throat:     Mouth: Mucous membranes are moist.  Eyes:     General: No scleral icterus.    Extraocular Movements: Extraocular movements intact.  Neck:     Vascular: No carotid bruit.  Cardiovascular:     Rate and Rhythm: Normal rate and regular rhythm.     Pulses: Normal pulses.     Heart sounds: Normal heart sounds. No murmur heard. Pulmonary:     Breath sounds: Normal breath sounds. No wheezing or rales.  Abdominal:     Palpations: Abdomen is soft.     Tenderness: There is no abdominal tenderness.  Musculoskeletal:     Cervical back: Neck supple. No tenderness.     Right lower leg: No edema.      Left lower leg: No edema.  Skin:    General: Skin is warm.     Findings: Lesion (About 1 cm linear erythema over medial malleolus of left leg) present. No rash.  Neurological:     General: No focal deficit present.     Mental Status: She is alert and oriented to person, place, and time.     Cranial Nerves: No cranial nerve deficit.     Sensory:  No sensory deficit.     Motor: No weakness.  Psychiatric:        Mood and Affect: Mood normal.        Behavior: Behavior normal.     BP 124/82 (BP Location: Right Arm, Patient Position: Sitting, Cuff Size: Normal)   Pulse 100   Resp 18   Ht '5\' 8"'$  (1.727 m)   Wt 152 lb (68.9 kg)   SpO2 99%   BMI 23.11 kg/m  Wt Readings from Last 3 Encounters:  04/18/22 152 lb (68.9 kg)  03/27/22 151 lb 3.8 oz (68.6 kg)  03/23/22 151 lb 3.8 oz (68.6 kg)    Lab Results  Component Value Date   TSH 2.35 12/18/2018   Lab Results  Component Value Date   WBC 11.9 12/18/2018   HGB 13.9 03/27/2022   HCT 41.0 03/27/2022   MCV 100.8 (H) 03/08/2017   PLT 272 03/08/2017   Lab Results  Component Value Date   NA 142 03/27/2022   K 3.6 03/27/2022   CO2 26 03/23/2022   GLUCOSE 103 (H) 03/27/2022   BUN 13 03/27/2022   CREATININE 1.30 (H) 03/27/2022   BILITOT 0.6 03/08/2017   AST 23 03/08/2017   ALT 17 03/08/2017   PROT 7.3 03/08/2017   CALCIUM 8.8 (L) 03/23/2022   ANIONGAP 8 03/23/2022   Lab Results  Component Value Date   CHOL 118 11/09/2021   Lab Results  Component Value Date   HDL 49 11/09/2021   Lab Results  Component Value Date   LDLCALC 42 11/09/2021   Lab Results  Component Value Date   TRIG 162 (A) 11/09/2021   Lab Results  Component Value Date   CHOLHDL 3.3 03/08/2017   Lab Results  Component Value Date   HGBA1C 5.2 12/18/2018      Assessment & Plan:   Problem List Items Addressed This Visit       Cardiovascular and Mediastinum   Essential hypertension    BP Readings from Last 1 Encounters:  04/18/22 124/82   Well-controlled with Losartan-HCTZ 100-25 mg QD Counseled for compliance with the medications Advised DASH diet and moderate exercise/walking, at least 150 mins/week        Musculoskeletal and Integument   Age-related osteoporosis without current pathological fracture    Had headache with alendronate (?) Will give trial of Ibandronate Continue vitamin D supplement      Relevant Medications   ibandronate (BONIVA) 150 MG tablet     Genitourinary   Chronic kidney disease, stage 3b (HCC)    Last BMP reviewed, stable GFR at 44 Advised to increase water intake Will check BMP after 4 months, if persistent elevated Cr. with low GFR, will refer to Nephrology - she has denied referral for now On Farxiga 5 mg daily Checked urine microalbumin/creatinine ratio      Relevant Orders   Basic Metabolic Panel (BMET)   Phosphorus     Other   Encounter for general adult medical examination with abnormal findings - Primary    Physical exam as documented. Blood tests reviewed and discussed with the patient in detail. Has had Pneumococcal, Shingrix and flu vaccines.      Other Visit Diagnoses     Skin lesion     Appears to be insect bite on left ankle Advised to keep area clean and dry       Meds ordered this encounter  Medications   ibandronate (BONIVA) 150 MG tablet    Sig: Take 1 tablet (  150 mg total) by mouth every 30 (thirty) days. Take in the morning with a full glass of water, on an empty stomach, and do not take anything else by mouth or lie down for the next 30 min.    Dispense:  1 tablet    Refill:  3    Follow-up: Return in about 4 months (around 08/19/2022).    Lindell Spar, MD

## 2022-04-18 NOTE — Assessment & Plan Note (Signed)
Had headache with alendronate (?) Will give trial of Ibandronate Continue vitamin D supplement

## 2022-04-18 NOTE — Assessment & Plan Note (Signed)
Last BMP reviewed, stable GFR at 44 Advised to increase water intake Will check BMP after 4 months, if persistent elevated Cr. with low GFR, will refer to Nephrology - she has denied referral for now On Farxiga 5 mg daily Checked urine microalbumin/creatinine ratio

## 2022-04-18 NOTE — Assessment & Plan Note (Signed)
BP Readings from Last 1 Encounters:  04/18/22 124/82   Well-controlled with Losartan-HCTZ 100-25 mg QD Counseled for compliance with the medications Advised DASH diet and moderate exercise/walking, at least 150 mins/week

## 2022-06-23 ENCOUNTER — Other Ambulatory Visit: Payer: Self-pay | Admitting: Internal Medicine

## 2022-06-23 DIAGNOSIS — I10 Essential (primary) hypertension: Secondary | ICD-10-CM

## 2022-06-24 ENCOUNTER — Other Ambulatory Visit: Payer: Self-pay | Admitting: Internal Medicine

## 2022-06-24 DIAGNOSIS — N1832 Chronic kidney disease, stage 3b: Secondary | ICD-10-CM

## 2022-07-16 ENCOUNTER — Other Ambulatory Visit: Payer: Self-pay | Admitting: Internal Medicine

## 2022-07-16 DIAGNOSIS — M810 Age-related osteoporosis without current pathological fracture: Secondary | ICD-10-CM

## 2022-07-21 ENCOUNTER — Other Ambulatory Visit: Payer: Self-pay | Admitting: Internal Medicine

## 2022-07-21 DIAGNOSIS — N1832 Chronic kidney disease, stage 3b: Secondary | ICD-10-CM

## 2022-08-10 ENCOUNTER — Other Ambulatory Visit: Payer: Self-pay | Admitting: Internal Medicine

## 2022-08-10 DIAGNOSIS — I10 Essential (primary) hypertension: Secondary | ICD-10-CM

## 2022-08-22 ENCOUNTER — Ambulatory Visit (INDEPENDENT_AMBULATORY_CARE_PROVIDER_SITE_OTHER): Payer: Commercial Managed Care - PPO | Admitting: Internal Medicine

## 2022-08-22 ENCOUNTER — Encounter: Payer: Self-pay | Admitting: Internal Medicine

## 2022-08-22 VITALS — BP 156/86 | HR 103 | Ht 68.0 in | Wt 146.4 lb

## 2022-08-22 DIAGNOSIS — M810 Age-related osteoporosis without current pathological fracture: Secondary | ICD-10-CM

## 2022-08-22 DIAGNOSIS — H65193 Other acute nonsuppurative otitis media, bilateral: Secondary | ICD-10-CM

## 2022-08-22 DIAGNOSIS — J432 Centrilobular emphysema: Secondary | ICD-10-CM

## 2022-08-22 DIAGNOSIS — I10 Essential (primary) hypertension: Secondary | ICD-10-CM | POA: Diagnosis not present

## 2022-08-22 DIAGNOSIS — N1832 Chronic kidney disease, stage 3b: Secondary | ICD-10-CM | POA: Diagnosis not present

## 2022-08-22 DIAGNOSIS — N2581 Secondary hyperparathyroidism of renal origin: Secondary | ICD-10-CM

## 2022-08-22 DIAGNOSIS — H65113 Acute and subacute allergic otitis media (mucoid) (sanguinous) (serous), bilateral: Secondary | ICD-10-CM | POA: Diagnosis not present

## 2022-08-22 DIAGNOSIS — Z122 Encounter for screening for malignant neoplasm of respiratory organs: Secondary | ICD-10-CM

## 2022-08-22 DIAGNOSIS — Z720 Tobacco use: Secondary | ICD-10-CM

## 2022-08-22 DIAGNOSIS — I700 Atherosclerosis of aorta: Secondary | ICD-10-CM | POA: Diagnosis not present

## 2022-08-22 HISTORY — DX: Encounter for screening for malignant neoplasm of respiratory organs: Z12.2

## 2022-08-22 MED ORDER — AMLODIPINE BESYLATE 10 MG PO TABS: 10.0000 mg | ORAL_TABLET | Freq: Every day | ORAL | 3 refills | Status: DC

## 2022-08-22 MED ORDER — IBANDRONATE SODIUM 150 MG PO TABS: ORAL_TABLET | ORAL | 3 refills | Status: DC

## 2022-08-22 MED ORDER — DAPAGLIFLOZIN PROPANEDIOL 5 MG PO TABS: 5.0000 mg | ORAL_TABLET | Freq: Every day | ORAL | 5 refills | Status: DC

## 2022-08-22 MED ORDER — OFLOXACIN 0.3 % OT SOLN: 5.0000 [drp] | Freq: Every day | OTIC | 0 refills | Status: DC

## 2022-08-22 NOTE — Assessment & Plan Note (Signed)
Lipid profile reviewed On Lipitor 10 mg daily

## 2022-08-22 NOTE — Assessment & Plan Note (Addendum)
Had headache with alendronate (?) On Ibandronate now, tolerates it well Continue vitamin D supplement

## 2022-08-22 NOTE — Progress Notes (Addendum)
Established Patient Office Visit  Subjective:  Patient ID: Diana Hubbard, female    DOB: July 10, 1955  Age: 67 y.o. MRN: [id number]  CC:  Chief Complaint  Patient presents with   Hypertension    Four month follow up. Patient ears have been stopped up and sounding muffled since Jan. She had them checked at work and there was nothing seen, she been taking to allergy medicine to see if it would help.    HPI Diana Hubbard is a 67 y.o. female with past medical history of HTN, CKD and HLD who presents for f/u of her chronic medical conditions.  HTN: BP is elevated today. She reports that she has had elevated BP at her workplace as well. Takes medications regularly. Patient denies headache, dizziness, chest pain, dyspnea or palpitations.  HLD: On Lipitor. Lipid profile reviewed.   CKD: She has brought blood test reports from her workplace.  Her last BMP shows GFR of 43, which is stable.  She states that she has poor PO intake of fluids. She prefers to increase water intake for now and if persistent elevated kidney function tests, she will see a Nephrologist. Denies urinary hesitancy, dysuria or hematuria. She has been taking [place] now.  She c/o bilateral ear clogging and discomfort with hearing impairment for the last 1 month.  She denies any fever or chills.  Denies any ear discharge.  She was evaluated by a nurse at her workplace, and was told to try Claritin for allergies/nasal congestion.  Of note, she denies any nasal congestion or postnasal drip currently.      Past Medical History:  Diagnosis Date   Abnormal Pap smear of cervix 01/12/2016   LSIL will get colpo   Age-related osteoporosis without current pathological fracture 12/22/2021   COPD (chronic obstructive pulmonary disease) ([place])    Hypertension    Mixed hyperlipidemia 06/17/2020   Rectocele 01/06/2016   Screening for lung cancer 08/22/2022   Stage 3a chronic kidney disease ([place]) 06/17/2020   Vaginal Pap smear,  abnormal     Past Surgical History:  Procedure Laterality Date   APPENDECTOMY     CHOLECYSTECTOMY     COLONOSCOPY WITH PROPOFOL N/A 03/27/2022   Procedure: COLONOSCOPY WITH PROPOFOL;  Surgeon: Eloise Harman, DO;  [facility];  Service: Endoscopy;  Laterality: N/A;  11:30 am   POLYPECTOMY  03/27/2022   Procedure: POLYPECTOMY;  Surgeon: Eloise Harman, DO;  [facility];  Service: Endoscopy;;   TUBAL LIGATION      Family History  Problem Relation Age of Onset   Diabetes Mother    Hypertension Mother    Hyperlipidemia Mother    Other Father        abd aneursym   Hyperlipidemia Father    Hypertension Father    Hemophilia Father    Cancer Maternal Grandmother        uterine    Social History   Socioeconomic History   Marital status: Widowed    Spouse name: Elta Guadeloupe   Number of children: 2   Years of education: 13   Highest education level: Not on file  Occupational History   Occupation: Product/process development scientist: Engineer, water    Comment: [employer]  Tobacco Use   Smoking status: Every Day    Packs/day: 0.75    Years: 40.00    Total pack years: 30.00    Types: Cigarettes    Passive exposure: Current   Smokeless tobacco: Never  Substance and Sexual Activity   Alcohol use: Yes    Comment: social   Drug use: No   Sexual activity: Yes    Birth control/protection: Post-menopausal, Surgical    Comment: tubal  Other Topics Concern   Not on file  Social History Narrative   Married.  Lives with Husband   Limited caffeine up to 2 -3 cups a day   No reg exercise, walk at work   Social Determinants of Radio broadcast assistant Strain: Not on file  Food Insecurity: Not on file  Transportation Needs: Not on file  Physical Activity: Not on file  Stress: Not on file  Social Connections: Not on file  Intimate Partner Violence: Not on file    Outpatient Medications Prior to Visit  Medication Sig Dispense Refill   acetaminophen (TYLENOL) 500 MG  tablet Take 1,000 mg by mouth 2 (two) times daily.     atorvastatin (LIPITOR) 10 MG tablet Take 1 tablet (10 mg total) by mouth daily. 90 tablet 3   losartan-hydrochlorothiazide (HYZAAR) 100-25 MG tablet TAKE 1 TABLET BY MOUTH DAILY 90 tablet 1   amLODipine (NORVASC) 5 MG tablet TAKE 1 TABLET(5 MG) BY MOUTH DAILY 90 tablet 0   FARXIGA 5 MG TABS tablet TAKE 1 TABLET(5 MG) BY MOUTH DAILY BEFORE BREAKFAST 30 tablet 2   ibandronate (BONIVA) 150 MG tablet TAKE 1 TABLET BY MOUTH IN MORNING WITH A FULL GLASS OF WATER ON EMPTY STOMACH ONCE MONTHLY. DON'T LIE DOWN/TAKE ANYTHING BY MOUTH FOR30MINUTES 3 tablet 0   No facility-administered medications prior to visit.    Allergies  Allergen Reactions   Ace Inhibitors Cough    cough    ROS Review of Systems  Constitutional:  Negative for chills and fever.  HENT:  Negative for congestion, sinus pressure, sinus pain and sore throat.        B/l ear clogging and hearing problem  Eyes:  Negative for pain and discharge.  Respiratory:  Negative for cough and shortness of breath.   Cardiovascular:  Negative for chest pain and palpitations.  Gastrointestinal:  Negative for abdominal pain, diarrhea, nausea and vomiting.  Endocrine: Negative for polydipsia and polyuria.  Genitourinary:  Negative for dysuria and hematuria.  Musculoskeletal:  Negative for neck pain and neck stiffness.  Skin:  Negative for rash.  Neurological:  Negative for dizziness, seizures, syncope and weakness.  Psychiatric/Behavioral:  Negative for agitation and behavioral problems.       Objective:    Physical Exam Vitals reviewed.  Constitutional:      General: She is not in acute distress.    Appearance: She is not diaphoretic.  HENT:     Head: Normocephalic and atraumatic.     Right Ear: A middle ear effusion is present. There is impacted cerumen.     Left Ear: A middle ear effusion is present. There is impacted cerumen.     Nose: Nose normal.     Mouth/Throat:      Mouth: Mucous membranes are moist.  Eyes:     General: No scleral icterus.    Extraocular Movements: Extraocular movements intact.  Neck:     Vascular: No carotid bruit.  Cardiovascular:     Rate and Rhythm: Normal rate and regular rhythm.     Pulses: Normal pulses.     Heart sounds: Normal heart sounds. No murmur heard.    No friction rub. No gallop.  Pulmonary:     Breath sounds: Normal breath sounds. No wheezing or  rales.  Musculoskeletal:     Cervical back: Neck supple. No tenderness.     Right lower leg: No edema.     Left lower leg: No edema.  Skin:    General: Skin is warm.     Findings: No rash.  Neurological:     General: No focal deficit present.     Mental Status: She is alert and oriented to person, place, and time.     Cranial Nerves: No cranial nerve deficit.     Sensory: No sensory deficit.     Motor: No weakness.  Psychiatric:        Mood and Affect: Mood normal.        Behavior: Behavior normal.     BP (!) 156/86 (BP Location: Right Arm, Cuff Size: Normal)   Pulse (!) 103   Ht 5' 8"$  (1.727 m)   Wt 146 lb 6.4 oz (66.4 kg)   SpO2 97%   BMI 22.26 kg/m  Wt Readings from Last 3 Encounters:  08/22/22 146 lb 6.4 oz (66.4 kg)  04/18/22 152 lb (68.9 kg)  03/27/22 151 lb 3.8 oz (68.6 kg)    Lab Results  Component Value Date   TSH 2.35 12/18/2018   Lab Results  Component Value Date   WBC 11.9 12/18/2018   HGB 13.9 03/27/2022   HCT 41.0 03/27/2022   MCV 100.8 (H) 03/08/2017   PLT 272 03/08/2017   Lab Results  Component Value Date   NA 142 03/27/2022   K 3.6 03/27/2022   CO2 26 03/23/2022   GLUCOSE 103 (H) 03/27/2022   BUN 13 03/27/2022   CREATININE 1.30 (H) 03/27/2022   BILITOT 0.6 03/08/2017   AST 23 03/08/2017   ALT 17 03/08/2017   PROT 7.3 03/08/2017   CALCIUM 8.8 (L) 03/23/2022   ANIONGAP 8 03/23/2022   Lab Results  Component Value Date   CHOL 118 11/09/2021   Lab Results  Component Value Date   HDL 49 11/09/2021   Lab Results   Component Value Date   LDLCALC 42 11/09/2021   Lab Results  Component Value Date   TRIG 162 (A) 11/09/2021   Lab Results  Component Value Date   CHOLHDL 3.3 03/08/2017   Lab Results  Component Value Date   HGBA1C 5.2 12/18/2018      Assessment & Plan:   Problem List Items Addressed This Visit       Cardiovascular and Mediastinum   Essential hypertension - Primary    BP Readings from Last 1 Encounters:  08/22/22 (!) 156/86  Uncontrolled with Losartan-HCTZ 100-25 mg QD and Amlodipine 5 mg QD Increased dose of amlodipine to 10 mg daily Counseled for compliance with the medications Advised DASH diet and moderate exercise/walking, at least 150 mins/week      Relevant Medications   amLODipine (NORVASC) 10 MG tablet   Aortic atherosclerosis (HCC)    Lipid profile reviewed On Lipitor 10 mg daily      Relevant Medications   amLODipine (NORVASC) 10 MG tablet     Respiratory   Centrilobular emphysema (HCC)    Noticed on low dose CT chest done for lung cancer screening No active complaints currently Counseled to quit smoking        Endocrine   Hyperparathyroidism, secondary ([place])    Likely due to CKD PTH - 85 On Vitamin D supplement, Ca wnl      Relevant Orders   Parathyroid hormone, intact (no Ca)     Nervous and  Auditory   Acute mucoid otitis media of both ears    Has bilateral middle ear effusion and impacted earwax Ear irrigation done today, tolerated procedure well Ofloxacin otic drops for otitis media If she has persistent symptoms, will refer to ENT specialist      Relevant Medications   ofloxacin (FLOXIN) 0.3 % OTIC solution     Musculoskeletal and Integument   Age-related osteoporosis without current pathological fracture    Had headache with alendronate (?) On Ibandronate now, tolerates it well Continue vitamin D supplement      Relevant Medications   ibandronate (BONIVA) 150 MG tablet     Genitourinary   Chronic kidney disease, stage  3b (HCC)    Last BMP reviewed, stable GFR at 43 Advised to increase water intake Will check BMP after 4 months, if persistent elevated Cr. with low GFR, will refer to Nephrology - she has denied referral for now On Farxiga 5 mg daily Checked urine albumin/creatinine ratio - wnl      Relevant Medications   dapagliflozin propanediol (FARXIGA) 5 MG TABS tablet   Other Relevant Orders   Basic Metabolic Panel (BMET)   Urine Microalbumin w/creat. ratio   CBC with Differential/Platelet     Other   Tobacco abuse    Smokes about 0.3 pack/day  Asked about quitting: confirms that he/she currently smokes cigarettes Advise to quit smoking: Educated about QUITTING to reduce the risk of cancer, cardio and cerebrovascular disease. Assess willingness: Unwilling to quit at this time, but is working on cutting back. Assist with counseling and pharmacotherapy: Counseled for 5 minutes and literature provided. Arrange for follow up: follow up in 3 months and continue to offer help.      Screening for lung cancer    Has > 20-pack-year smoking history Ordered low-dose CT chest after discussing with the patient.       Relevant Orders   CT CHEST LUNG CANCER SCREENING LOW DOSE WO CONTRAST   Meds ordered this encounter  Medications   amLODipine (NORVASC) 10 MG tablet    Sig: Take 1 tablet (10 mg total) by mouth daily.    Dispense:  90 tablet    Refill:  3   dapagliflozin propanediol (FARXIGA) 5 MG TABS tablet    Sig: Take 1 tablet (5 mg total) by mouth daily.    Dispense:  30 tablet    Refill:  5   ibandronate (BONIVA) 150 MG tablet    Sig: TAKE 1 TABLET BY MOUTH IN MORNING WITH A FULL GLASS OF WATER ON EMPTY STOMACH ONCE MONTHLY. DON'T LIE DOWN/TAKE ANYTHING BY MOUTH FOR30MINUTES    Dispense:  3 tablet    Refill:  3    **Patient requests 90 days supply**   ofloxacin (FLOXIN) 0.3 % OTIC solution    Sig: Place 5 drops into both ears daily.    Dispense:  5 mL    Refill:  0    Follow-up:  Return in about 4 months (around 12/21/2022) for HTN and CKD.    Lindell Spar, MD

## 2022-08-22 NOTE — Assessment & Plan Note (Signed)
Has > 20-pack-year smoking history Ordered low-dose CT chest after discussing with the patient.

## 2022-08-22 NOTE — Assessment & Plan Note (Signed)
Noticed on low dose CT chest done for lung cancer screening No active complaints currently Counseled to quit smoking

## 2022-08-22 NOTE — Assessment & Plan Note (Addendum)
BP Readings from Last 1 Encounters:  08/22/22 (!) 156/86   Uncontrolled with Losartan-HCTZ 100-25 mg QD and Amlodipine 5 mg QD Increased dose of amlodipine to 10 mg daily Counseled for compliance with the medications Advised DASH diet and moderate exercise/walking, at least 150 mins/week

## 2022-08-22 NOTE — Assessment & Plan Note (Signed)
Has bilateral middle ear effusion and impacted earwax Ear irrigation done today, tolerated procedure well Ofloxacin otic drops for otitis media If she has persistent symptoms, will refer to ENT specialist

## 2022-08-22 NOTE — Patient Instructions (Signed)
Please start taking Amlodipine 10 mg instead of 5 mg once daily.  Please continue taking other medications as prescribed.  Please continue to follow low salt diet and perform moderate exercise/walking at least 150 mins/week.

## 2022-08-22 NOTE — Assessment & Plan Note (Signed)
Smokes about 0.3 pack/day  Asked about quitting: confirms that he/she currently smokes cigarettes Advise to quit smoking: Educated about QUITTING to reduce the risk of cancer, cardio and cerebrovascular disease. Assess willingness: Unwilling to quit at this time, but is working on cutting back. Assist with counseling and pharmacotherapy: Counseled for 5 minutes and literature provided. Arrange for follow up: follow up in 3 months and continue to offer help.

## 2022-08-22 NOTE — Assessment & Plan Note (Signed)
Likely due to CKD PTH - 85 On Vitamin D supplement, Ca wnl

## 2022-08-22 NOTE — Assessment & Plan Note (Addendum)
Last BMP reviewed, stable GFR at 43 Advised to increase water intake Will check BMP after 4 months, if persistent elevated Cr. with low GFR, will refer to Nephrology - she has denied referral for now On Farxiga 5 mg daily Checked urine albumin/creatinine ratio - wnl

## 2022-08-29 ENCOUNTER — Telehealth: Payer: Self-pay | Admitting: Internal Medicine

## 2022-08-29 NOTE — Telephone Encounter (Signed)
lmtrc

## 2022-08-29 NOTE — Telephone Encounter (Signed)
Patient called in regard to  dapagliflozin propanediol (FARXIGA) 5 MG TABS tablet  Patient cannot get med refilled due to [facility]. Unable to bill insurance due to the system hack.  Patient wants a call back in regard. Wants to know if office has samples or if it is ok to stop med until able to refill

## 2022-08-30 ENCOUNTER — Telehealth: Payer: Self-pay | Admitting: Internal Medicine

## 2022-08-30 NOTE — Telephone Encounter (Signed)
Pt called stating she was able to get the medication situation handled. Just wanted to let you know.

## 2022-09-19 ENCOUNTER — Other Ambulatory Visit: Payer: Self-pay | Admitting: Internal Medicine

## 2022-09-19 DIAGNOSIS — I10 Essential (primary) hypertension: Secondary | ICD-10-CM

## 2022-10-19 ENCOUNTER — Encounter: Payer: Self-pay | Admitting: Internal Medicine

## 2022-10-19 ENCOUNTER — Ambulatory Visit (INDEPENDENT_AMBULATORY_CARE_PROVIDER_SITE_OTHER): Payer: Commercial Managed Care - PPO | Admitting: Internal Medicine

## 2022-10-19 VITALS — BP 128/75 | HR 99 | Ht 68.0 in | Wt 148.8 lb

## 2022-10-19 DIAGNOSIS — H65195 Other acute nonsuppurative otitis media, recurrent, left ear: Secondary | ICD-10-CM

## 2022-10-19 DIAGNOSIS — H6122 Impacted cerumen, left ear: Secondary | ICD-10-CM

## 2022-10-19 DIAGNOSIS — J301 Allergic rhinitis due to pollen: Secondary | ICD-10-CM | POA: Diagnosis not present

## 2022-10-19 DIAGNOSIS — H6690 Otitis media, unspecified, unspecified ear: Secondary | ICD-10-CM | POA: Insufficient documentation

## 2022-10-19 MED ORDER — FLUTICASONE PROPIONATE 50 MCG/ACT NA SUSP: 2.0000 | Freq: Every day | NASAL | 1 refills | Status: DC

## 2022-10-19 NOTE — Assessment & Plan Note (Signed)
Had bilateral middle ear effusion and impacted earwax in 02/24 Ofloxacin otic drops for otitis media was given, but still has decreased hearing on left side Ear irrigation done today, tolerated procedure well She has persistent symptoms, will refer to ENT specialist

## 2022-10-19 NOTE — Assessment & Plan Note (Signed)
Started Flonase, can also help with ear declogging

## 2022-10-19 NOTE — Progress Notes (Signed)
Acute Office Visit  Subjective:    Patient ID: Diana Hubbard, female    DOB: 05/04/1956, 67 y.o.   MRN: [id number]  Chief Complaint  Patient presents with   Ear Fullness    Patient states her ears are clogged, she has done ear drops and is not getting any relief    HPI Patient is in today for complaint of left ear clogging and decreased hearing.  She was treated with ofloxacin eardrops for acute otitis media in 02/24.  She has noticed improvement in her symptoms on the right side.  Denies any ear pain or discharge currently.  Past Medical History:  Diagnosis Date   Abnormal Pap smear of cervix 01/12/2016   LSIL will get colpo   Age-related osteoporosis without current pathological fracture 12/22/2021   COPD (chronic obstructive pulmonary disease)    Hypertension    Mixed hyperlipidemia 06/17/2020   Rectocele 01/06/2016   Screening for lung cancer 08/22/2022   Stage 3a chronic kidney disease 06/17/2020   Vaginal Pap smear, abnormal     Past Surgical History:  Procedure Laterality Date   APPENDECTOMY     CHOLECYSTECTOMY     COLONOSCOPY WITH PROPOFOL N/A 03/27/2022   Procedure: COLONOSCOPY WITH PROPOFOL;  Surgeon: Lanelle Bal, DO;  [facility];  Service: Endoscopy;  Laterality: N/A;  11:30 am   POLYPECTOMY  03/27/2022   Procedure: POLYPECTOMY;  Surgeon: Lanelle Bal, DO;  [facility];  Service: Endoscopy;;   TUBAL LIGATION      Family History  Problem Relation Age of Onset   Diabetes Mother    Hypertension Mother    Hyperlipidemia Mother    Other Father        abd aneursym   Hyperlipidemia Father    Hypertension Father    Hemophilia Father    Cancer Maternal Grandmother        uterine    Social History   Socioeconomic History   Marital status: Widowed    Spouse name: Loraine Leriche   Number of children: 2   Years of education: 13   Highest education level: Not on file  Occupational History   Occupation: Secretary/administrator:  [employer]    Comment: [employer]  Tobacco Use   Smoking status: Every Day    Packs/day: 0.75    Years: 40.00    Additional pack years: 0.00    Total pack years: 30.00    Types: Cigarettes    Passive exposure: Current   Smokeless tobacco: Never  Substance and Sexual Activity   Alcohol use: Yes    Comment: social   Drug use: No   Sexual activity: Yes    Birth control/protection: Post-menopausal, Surgical    Comment: tubal  Other Topics Concern   Not on file  Social History Narrative   Married.  Lives with Husband   Limited caffeine up to 2 -3 cups a day   No reg exercise, walk at work   Social Determinants of Corporate investment banker Strain: Not on file  Food Insecurity: Not on file  Transportation Needs: Not on file  Physical Activity: Not on file  Stress: Not on file  Social Connections: Not on file  Intimate Partner Violence: Not on file    Outpatient Medications Prior to Visit  Medication Sig Dispense Refill   acetaminophen (TYLENOL) 500 MG tablet Take 1,000 mg by mouth 2 (two) times daily.     amLODipine (NORVASC) 10 MG  tablet Take 1 tablet (10 mg total) by mouth daily. 90 tablet 3   atorvastatin (LIPITOR) 10 MG tablet Take 1 tablet (10 mg total) by mouth daily. 90 tablet 3   dapagliflozin propanediol (FARXIGA) 5 MG TABS tablet Take 1 tablet (5 mg total) by mouth daily. 30 tablet 5   ibandronate (BONIVA) 150 MG tablet TAKE 1 TABLET BY MOUTH IN MORNING WITH A FULL GLASS OF WATER ON EMPTY STOMACH ONCE MONTHLY. DON'T LIE DOWN/TAKE ANYTHING BY MOUTH FOR30MINUTES 3 tablet 3   losartan-hydrochlorothiazide (HYZAAR) 100-25 MG tablet TAKE 1 TABLET BY MOUTH DAILY 90 tablet 1   ofloxacin (FLOXIN) 0.3 % OTIC solution Place 5 drops into both ears daily. 5 mL 0   No facility-administered medications prior to visit.    Allergies  Allergen Reactions   Ace Inhibitors Cough    cough    Review of Systems  Constitutional:  Negative for chills and fever.  HENT:  Positive  for congestion and hearing loss (Left side). Negative for sinus pressure, sinus pain and sore throat.        Left ear clogging and hearing problem  Eyes:  Negative for pain and discharge.  Respiratory:  Negative for cough and shortness of breath.   Cardiovascular:  Negative for chest pain and palpitations.  Gastrointestinal:  Negative for abdominal pain, diarrhea, nausea and vomiting.  Endocrine: Negative for polydipsia and polyuria.  Genitourinary:  Negative for dysuria and hematuria.  Musculoskeletal:  Negative for neck pain and neck stiffness.  Skin:  Negative for rash.  Neurological:  Negative for dizziness, seizures, syncope and weakness.  Psychiatric/Behavioral:  Negative for agitation and behavioral problems.        Objective:    Physical Exam Vitals reviewed.  Constitutional:      General: She is not in acute distress.    Appearance: She is not diaphoretic.  HENT:     Head: Normocephalic and atraumatic.     Left Ear: There is impacted cerumen.     Nose: Congestion present.     Mouth/Throat:     Mouth: Mucous membranes are moist.  Eyes:     General: No scleral icterus.    Extraocular Movements: Extraocular movements intact.  Neck:     Vascular: No carotid bruit.  Cardiovascular:     Rate and Rhythm: Normal rate and regular rhythm.     Pulses: Normal pulses.     Heart sounds: Normal heart sounds. No murmur heard.    No friction rub. No gallop.  Pulmonary:     Breath sounds: Normal breath sounds. No wheezing or rales.  Musculoskeletal:     Cervical back: Neck supple. No tenderness.     Right lower leg: No edema.     Left lower leg: No edema.  Skin:    General: Skin is warm.     Findings: No rash.  Neurological:     General: No focal deficit present.     Mental Status: She is alert and oriented to person, place, and time.     Cranial Nerves: No cranial nerve deficit.     Sensory: No sensory deficit.     Motor: No weakness.  Psychiatric:        Mood and  Affect: Mood normal.        Behavior: Behavior normal.     BP 128/75 (BP Location: Right Arm, Patient Position: Sitting, Cuff Size: Normal)   Pulse 99   Ht  (1.727 m)   Wt 148 lb  12.8 oz (67.5 kg)   SpO2 98%   BMI 22.62 kg/m  Wt Readings from Last 3 Encounters:  10/19/22 148 lb 12.8 oz (67.5 kg)  08/22/22 146 lb 6.4 oz (66.4 kg)  04/18/22 152 lb (68.9 kg)        Assessment & Plan:   Problem List Items Addressed This Visit       Respiratory   Allergic rhinitis due to pollen - Primary    Started Flonase, can also help with ear declogging      Relevant Medications   fluticasone (FLONASE) 50 MCG/ACT nasal spray     Nervous and Auditory   Excessive ear wax, left    Left ear impacted cerumen, ear irrigation done today She has had recurrent wax buildup Advised to use Debrox eardrops as needed Referred to ENT specialist      Relevant Orders   Ambulatory referral to ENT   Otitis media    Had bilateral middle ear effusion and impacted earwax in 02/24 Ofloxacin otic drops for otitis media was given, but still has decreased hearing on left side Ear irrigation done today, tolerated procedure well She has persistent symptoms, will refer to ENT specialist      Relevant Orders   Ambulatory referral to ENT     Meds ordered this encounter  Medications   fluticasone (FLONASE) 50 MCG/ACT nasal spray    Sig: Place 2 sprays into both nostrils daily.    Dispense:  16 g    Refill:  1     Fady Stamps Concha Se, MD

## 2022-10-19 NOTE — Assessment & Plan Note (Signed)
Left ear impacted cerumen, ear irrigation done today She has had recurrent wax buildup Advised to use Debrox eardrops as needed Referred to ENT specialist

## 2022-10-19 NOTE — Patient Instructions (Signed)
Please use Flonase for nasal congestion. It can also help with ear declogging.  Okay to use Debrox ear drops for excess ear wax. Okay to use washcloth or gently wipe with Qtip. Do not use sharp objects for cleaning purposes.

## 2022-11-12 ENCOUNTER — Other Ambulatory Visit: Payer: Self-pay | Admitting: Internal Medicine

## 2022-11-12 DIAGNOSIS — E782 Mixed hyperlipidemia: Secondary | ICD-10-CM

## 2022-11-13 ENCOUNTER — Other Ambulatory Visit: Payer: Self-pay | Admitting: Internal Medicine

## 2022-11-13 DIAGNOSIS — M810 Age-related osteoporosis without current pathological fracture: Secondary | ICD-10-CM

## 2022-12-22 ENCOUNTER — Ambulatory Visit: Payer: Commercial Managed Care - PPO | Admitting: Internal Medicine

## 2023-01-23 ENCOUNTER — Ambulatory Visit: Payer: Commercial Managed Care - PPO | Admitting: Internal Medicine

## 2023-01-23 ENCOUNTER — Encounter: Payer: Self-pay | Admitting: Internal Medicine

## 2023-01-23 VITALS — BP 132/80 | HR 110 | Ht 68.0 in | Wt 149.6 lb

## 2023-01-23 DIAGNOSIS — N2581 Secondary hyperparathyroidism of renal origin: Secondary | ICD-10-CM | POA: Diagnosis not present

## 2023-01-23 DIAGNOSIS — I10 Essential (primary) hypertension: Secondary | ICD-10-CM | POA: Diagnosis not present

## 2023-01-23 DIAGNOSIS — H6522 Chronic serous otitis media, left ear: Secondary | ICD-10-CM

## 2023-01-23 DIAGNOSIS — N1832 Chronic kidney disease, stage 3b: Secondary | ICD-10-CM

## 2023-01-23 DIAGNOSIS — E559 Vitamin D deficiency, unspecified: Secondary | ICD-10-CM

## 2023-01-23 DIAGNOSIS — R739 Hyperglycemia, unspecified: Secondary | ICD-10-CM

## 2023-01-23 DIAGNOSIS — I700 Atherosclerosis of aorta: Secondary | ICD-10-CM

## 2023-01-23 DIAGNOSIS — E782 Mixed hyperlipidemia: Secondary | ICD-10-CM

## 2023-01-23 DIAGNOSIS — H538 Other visual disturbances: Secondary | ICD-10-CM | POA: Insufficient documentation

## 2023-01-23 MED ORDER — DAPAGLIFLOZIN PROPANEDIOL 10 MG PO TABS: 10.0000 mg | ORAL_TABLET | Freq: Every day | ORAL | 5 refills | Status: DC

## 2023-01-23 NOTE — Assessment & Plan Note (Addendum)
Likely due to CKD PTH: 85 -> 78 On Vitamin D supplement, Ca wnl If persistently elevated PTH, can add calcitriol

## 2023-01-23 NOTE — Progress Notes (Signed)
Established Patient Office Visit  Subjective:  Patient ID: BAILY HOVANEC, female    DOB: August 07, 1955  Age: 67 y.o. MRN: [id number]  CC:  Chief Complaint  Patient presents with   Hypertension    Four month follow up    Chronic Kidney Disease    Four month follow up    HPI ETTY ISAAC is a 67 y.o. female with past medical history of HTN, CKD and HLD who presents for f/u of her chronic medical conditions.  HTN: BP is elevated today, but improved later. She reports that she has had wnl BP at her workplace as well. Takes medications regularly. Patient denies headache, dizziness, chest pain, dyspnea or palpitations.  HLD: On Lipitor. Lipid profile reviewed.   CKD: She has brought blood test reports from her workplace.  Her last BMP shows GFR of 50, which is improved compared to prior.  She states that she has poor PO intake of fluids. She prefers to increase water intake for now and if persistent elevated kidney function tests, she will see a Nephrologist. Denies urinary hesitancy, dysuria or hematuria. She has been taking [place] now.  She had has seen ENT specialist for chronic serous left otitis media and is planned to get myringotomy tube. She still c/o bilateral ear discomfort with hearing impairment.  She denies any fever or chills.  Denies any ear discharge. Of note, she denies any nasal congestion or postnasal drip currently.  She reports blurry vision for the last 3 months.  Denies any eye pain or discharge.  Denies double vision.  She wears reading glasses.    Past Medical History:  Diagnosis Date   Abnormal Pap smear of cervix 01/12/2016   LSIL will get colpo   Age-related osteoporosis without current pathological fracture 12/22/2021   COPD (chronic obstructive pulmonary disease) (HCC)    Hypertension    Mixed hyperlipidemia 06/17/2020   Rectocele 01/06/2016   Screening for lung cancer 08/22/2022   Stage 3a chronic kidney disease (HCC) 06/17/2020   Vaginal Pap smear,  abnormal     Past Surgical History:  Procedure Laterality Date   APPENDECTOMY     CHOLECYSTECTOMY     COLONOSCOPY WITH PROPOFOL N/A 03/27/2022   Procedure: COLONOSCOPY WITH PROPOFOL;  Surgeon: Lanelle Bal, DO;  [facility];  Service: Endoscopy;  Laterality: N/A;  11:30 am   POLYPECTOMY  03/27/2022   Procedure: POLYPECTOMY;  Surgeon: Lanelle Bal, DO;  Location: [facility];  Service: Endoscopy;;   TUBAL LIGATION      Family History  Problem Relation Age of Onset   Diabetes Mother    Hypertension Mother    Hyperlipidemia Mother    Other Father        abd aneursym   Hyperlipidemia Father    Hypertension Father    Hemophilia Father    Cancer Maternal Grandmother        uterine    Social History   Socioeconomic History   Marital status: Widowed    Spouse name: Loraine Leriche   Number of children: 2   Years of education: 13   Highest education level: Not on file  Occupational History   Occupation: Secretary/administrator: [employer]    Comment: [employer]  Tobacco Use   Smoking status: Every Day    Current packs/day: 0.75    Average packs/day: 0.8 packs/day for 40.0 years (30.0 ttl pk-yrs)    Types: Cigarettes    Passive exposure: Current  Smokeless tobacco: Never  Substance and Sexual Activity   Alcohol use: Yes    Comment: social   Drug use: No   Sexual activity: Yes    Birth control/protection: Post-menopausal, Surgical    Comment: tubal  Other Topics Concern   Not on file  Social History Narrative   Married.  Lives with Husband   Limited caffeine up to 2 -3 cups a day   No reg exercise, walk at work   Social Determinants of Corporate investment banker Strain: Not on file  Food Insecurity: Not on file  Transportation Needs: Not on file  Physical Activity: Not on file  Stress: Not on file  Social Connections: Not on file  Intimate Partner Violence: Not on file    Outpatient Medications Prior to Visit  Medication Sig Dispense Refill    acetaminophen (TYLENOL) 500 MG tablet Take 1,000 mg by mouth 2 (two) times daily.     amLODipine (NORVASC) 10 MG tablet Take 1 tablet (10 mg total) by mouth daily. 90 tablet 3   atorvastatin (LIPITOR) 10 MG tablet TAKE 1 TABLET(10 MG) BY MOUTH DAILY 90 tablet 3   fluticasone (FLONASE) 50 MCG/ACT nasal spray Place 2 sprays into both nostrils daily. 16 g 1   ibandronate (BONIVA) 150 MG tablet ONE TABLET MONTHLY IN THE MORNING WITH FULL GLASS OF WATER, EMPTY STOMACH, DONT LIE DOWN TAKE ANYTHING FOR 30 MINUTES 3 tablet 3   losartan-hydrochlorothiazide (HYZAAR) 100-25 MG tablet TAKE 1 TABLET BY MOUTH DAILY 90 tablet 1   dapagliflozin propanediol (FARXIGA) 5 MG TABS tablet Take 1 tablet (5 mg total) by mouth daily. 30 tablet 5   ofloxacin (FLOXIN) 0.3 % OTIC solution Place 5 drops into both ears daily. 5 mL 0   No facility-administered medications prior to visit.    Allergies  Allergen Reactions   Ace Inhibitors Cough    cough    ROS Review of Systems  Constitutional:  Negative for chills and fever.  HENT:  Negative for congestion, sinus pressure, sinus pain and sore throat.        B/l ear clogging and hearing problem  Eyes:  Positive for visual disturbance. Negative for pain and discharge.  Respiratory:  Negative for cough and shortness of breath.   Cardiovascular:  Negative for chest pain and palpitations.  Gastrointestinal:  Negative for abdominal pain, diarrhea, nausea and vomiting.  Endocrine: Negative for polydipsia and polyuria.  Genitourinary:  Negative for dysuria and hematuria.  Musculoskeletal:  Negative for neck pain and neck stiffness.  Skin:  Negative for rash.  Neurological:  Negative for dizziness, seizures, syncope and weakness.  Psychiatric/Behavioral:  Negative for agitation and behavioral problems.       Objective:    Physical Exam Vitals reviewed.  Constitutional:      General: She is not in acute distress.    Appearance: She is not diaphoretic.  HENT:      Head: Normocephalic and atraumatic.     Left Ear: A middle ear effusion is present.     Nose: Nose normal.     Mouth/Throat:     Mouth: Mucous membranes are moist.  Eyes:     General: No scleral icterus.    Extraocular Movements: Extraocular movements intact.  Neck:     Vascular: No carotid bruit.  Cardiovascular:     Rate and Rhythm: Normal rate and regular rhythm.     Pulses: Normal pulses.     Heart sounds: Normal heart sounds. No murmur heard.  No friction rub. No gallop.  Pulmonary:     Breath sounds: Normal breath sounds. No wheezing or rales.  Musculoskeletal:     Cervical back: Neck supple. No tenderness.     Right lower leg: No edema.     Left lower leg: No edema.  Skin:    General: Skin is warm.     Findings: No rash.  Neurological:     General: No focal deficit present.     Mental Status: She is alert and oriented to person, place, and time.     Cranial Nerves: No cranial nerve deficit.     Sensory: No sensory deficit.     Motor: No weakness.  Psychiatric:        Mood and Affect: Mood normal.        Behavior: Behavior normal.     BP 132/80 (BP Location: Left Arm)   Pulse (!) 110   Ht 5\' 8"  (1.727 m)   Wt 149 lb 9.6 oz (67.9 kg)   SpO2 96%   BMI 22.75 kg/m  Wt Readings from Last 3 Encounters:  01/23/23 149 lb 9.6 oz (67.9 kg)  10/19/22 148 lb 12.8 oz (67.5 kg)  08/22/22 146 lb 6.4 oz (66.4 kg)    Lab Results  Component Value Date   TSH 2.35 12/18/2018   Lab Results  Component Value Date   WBC 11.9 12/18/2018   HGB 13.9 03/27/2022   HCT 41.0 03/27/2022   MCV 100.8 (H) 03/08/2017   PLT 272 03/08/2017   Lab Results  Component Value Date   NA 142 03/27/2022   K 3.6 03/27/2022   CO2 26 03/23/2022   GLUCOSE 103 (H) 03/27/2022   BUN 13 03/27/2022   CREATININE 1.30 (H) 03/27/2022   BILITOT 0.6 03/08/2017   AST 23 03/08/2017   ALT 17 03/08/2017   PROT 7.3 03/08/2017   CALCIUM 8.8 (L) 03/23/2022   ANIONGAP 8 03/23/2022   Lab Results   Component Value Date   CHOL 118 11/09/2021   Lab Results  Component Value Date   HDL 49 11/09/2021   Lab Results  Component Value Date   LDLCALC 42 11/09/2021   Lab Results  Component Value Date   TRIG 162 (A) 11/09/2021   Lab Results  Component Value Date   CHOLHDL 3.3 03/08/2017   Lab Results  Component Value Date   HGBA1C 5.2 12/18/2018      Assessment & Plan:   Problem List Items Addressed This Visit       Cardiovascular and Mediastinum   Essential hypertension - Primary    BP Readings from Last 1 Encounters:  01/23/23 132/80   Well-controlled with Losartan-HCTZ 100-25 mg QD and Amlodipine 10 mg QD Counseled for compliance with the medications Advised DASH diet and moderate exercise/walking, at least 150 mins/week      Relevant Orders   TSH   CMP14+EGFR   CBC with Differential/Platelet   Aortic atherosclerosis (HCC)    Lipid profile reviewed On Lipitor 10 mg daily        Endocrine   Hyperparathyroidism, secondary (HCC)    Likely due to CKD PTH: 85 -> 78 On Vitamin D supplement, Ca wnl If persistently elevated PTH, can add calcitriol        Nervous and Auditory   Otitis media    Had bilateral middle ear effusion and impacted earwax in 02/24 Ofloxacin otic drops for otitis media was given, but still has decreased hearing on left side Follow-up with  ENT specialist        Genitourinary   Chronic kidney disease, stage 3b (HCC)    Last BMP reviewed, stable GFR at 43, improved to 50 Advised to increase water intake Will check BMP after 4 months, if persistent elevated Cr. with low GFR, will refer to Nephrology - she has denied referral for now On Farxiga 5 mg daily Checked urine albumin/creatinine ratio - slightly elevated, on ARB and Farxiga, increased dose of Farxiga to 10 mg QD      Relevant Medications   dapagliflozin propanediol (FARXIGA) 10 MG TABS tablet   Other Relevant Orders   CMP14+EGFR   CBC with Differential/Platelet      Other   Mixed hyperlipidemia    Lipid profile reviewed On Lipitor 10 mg QD      Relevant Orders   Lipid panel   Vitamin D deficiency    Advised to take Vitamin D 5000 IU QD      Relevant Orders   VITAMIN D 25 Hydroxy (Vit-D Deficiency, Fractures)   Blurred vision    Could be a sign of early cataract Referred to ophthalmology      Relevant Orders   Ambulatory referral to Ophthalmology   Other Visit Diagnoses     Hyperglycemia       Relevant Orders   Hemoglobin A1c   CMP14+EGFR       Meds ordered this encounter  Medications   dapagliflozin propanediol (FARXIGA) 10 MG TABS tablet    Sig: Take 1 tablet (10 mg total) by mouth daily.    Dispense:  30 tablet    Refill:  5    Dose change - 01/23/23    Follow-up: Return in about 4 months (around 05/26/2023) for Annual physical.    Anabel Halon, MD

## 2023-01-23 NOTE — Assessment & Plan Note (Signed)
Advised to take Vitamin D 5000 IU QD 

## 2023-01-23 NOTE — Assessment & Plan Note (Addendum)
BP Readings from Last 1 Encounters:  01/23/23 132/80   Well-controlled with Losartan-HCTZ 100-25 mg QD and Amlodipine 10 mg QD Counseled for compliance with the medications Advised DASH diet and moderate exercise/walking, at least 150 mins/week

## 2023-01-23 NOTE — Assessment & Plan Note (Signed)
Lipid profile reviewed On Lipitor 10 mg daily

## 2023-01-23 NOTE — Patient Instructions (Addendum)
Please start taking Farxiga 10 mg once daily instead of 5 mg.  Please maintain at least 64 ounces of fluid in a day.  Please continue to take medications as prescribed.  Please continue to follow low salt diet and perform moderate exercise/walking at least 150 mins/week.  You are being referred to [facility] [address], [place], [place] [address] [phone]  Please get fasting blood tests done before the next visit.

## 2023-01-23 NOTE — Assessment & Plan Note (Addendum)
Could be a sign of early cataract Referred to ophthalmology

## 2023-01-23 NOTE — Assessment & Plan Note (Signed)
Had bilateral middle ear effusion and impacted earwax in 02/24 Ofloxacin otic drops for otitis media was given, but still has decreased hearing on left side Follow-up with ENT specialist

## 2023-01-23 NOTE — Assessment & Plan Note (Addendum)
Last BMP reviewed, stable GFR at 43, improved to 50 Advised to increase water intake Will check BMP after 4 months, if persistent elevated Cr. with low GFR, will refer to Nephrology - she has denied referral for now On Farxiga 5 mg daily Checked urine albumin/creatinine ratio - slightly elevated, on ARB and Farxiga, increased dose of Farxiga to 10 mg QD

## 2023-01-23 NOTE — Assessment & Plan Note (Signed)
Lipid profile reviewed On Lipitor 10 mg QD

## 2023-02-07 ENCOUNTER — Other Ambulatory Visit: Payer: Self-pay | Admitting: Internal Medicine

## 2023-02-07 DIAGNOSIS — I10 Essential (primary) hypertension: Secondary | ICD-10-CM

## 2023-02-23 ENCOUNTER — Other Ambulatory Visit: Payer: Self-pay | Admitting: Internal Medicine

## 2023-02-23 DIAGNOSIS — N1832 Chronic kidney disease, stage 3b: Secondary | ICD-10-CM

## 2023-04-10 NOTE — H&P (Signed)
Surgical History & Physical  Patient Name: Diana Hubbard  DOB: Dec 01, 1955  Surgery: Cataract extraction with intraocular lens implant phacoemulsification; Left Eye Surgeon: [name] MD Surgery Date: 04/16/2023 Pre-Op Date: 03/19/2023  HPI: A 66 Yr. old female patient presents for a cataract evaluation. Referred by Dr. Charise Killian. The patient complains of difficulty when viewing TV, reading closed caption, news scrolls on TV, which began for an unknown amount of time, but probably well over a year. The patient describes hazy symptoms affecting their eyes/vision. The condition is worse with daily activities. it is constant. The complaint is associated with blurry vision, glare and light sensitivity. Patient states that she is more bothered by the vision OS than OD. This is negatively affecting the patient's quality of life and the patient is unable to function adequately in life with the current level of vision. HPI was performed by [name] .  Medical History:  High Blood Pressure LDL  Review of Systems Cardiovascular High Blood Pressure All recorded systems are negative except as noted above.  Social Current every day smoker   Medication atorvastatin ,  amlodipine ,  losartan-hydrochlorothiazide ,  ibandronate ,  Farxiga ,  cephalexin   Sx/Procedures Gallbladder, Appendectomy  Drug Allergies  ACE Inhibitors    History & Physical: Heent: cataracts NECK: supple without bruits LUNGS: lungs clear to auscultation CV: regular rate and rhythm Abdomen: soft and non-tender  Impression & Plan: Assessment: 1.  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  COMBINED FORMS AGE RELATED CATARACT; Right Eye (H25.811) 3.  BLEPHARITIS; Right Upper Lid, Right Lower Lid, Left Upper Lid, Left Lower Lid (H01.001, H01.002,H01.004,H01.005) 4.  DERMATOCHALASIS, no surgery; Right Upper Lid, Left Upper Lid (H02.831, H02.834) 5.  CONJUNCTIVOCHALASIS; Both Eyes  (H11.823) 6.  ASTIGMATISM, REGULAR; Both Eyes (H52.223)  Plan: 1.  Cataract accounts for the patient's decreased vision. This visual impairment is not correctable with a tolerable change in glasses or contact lenses. Cataract surgery with an implantation of a new lens should significantly improve the visual and functional status of the patient. Discussed all risks, benefits, alternatives, and potential complications. Discussed the procedures and recovery. Patient desires to have surgery. A-scan ordered and performed today for intra-ocular lens calculations. The surgery will be performed in order to improve vision for driving, reading, and for eye examinations. Recommend phacoemulsification with intra-ocular lens. Recommend Dextenza for post-operative pain and inflammation. Left Eye - first. Dilates poorly - shugacaine by protocol. [employer]. Malyugin Ring. Omidira. Toric Lens.  2.  Will address after left eye.  3.  Blepharitis is present - recommend regular lid cleaning.  4.  Asymptomatic, recommend observation for now. Findings, prognosis and treatment options reviewed.  5.  Discussed condition and symptoms and is considered a type of Dry Eye Disease called "mechanical dry eye" caused by excessive tissue on the eye that is rubbed by the eyelids.  6.  Recommend toric IOL OU.

## 2023-04-11 ENCOUNTER — Encounter (HOSPITAL_COMMUNITY)
Admission: RE | Admit: 2023-04-11 | Discharge: 2023-04-11 | Disposition: A | Discharge status: Home / Self Care | Payer: Commercial Managed Care - PPO | Source: Ambulatory Visit | Attending: Ophthalmology | Admitting: Ophthalmology

## 2023-04-12 ENCOUNTER — Encounter (HOSPITAL_COMMUNITY): Payer: Self-pay

## 2023-04-16 ENCOUNTER — Ambulatory Visit (HOSPITAL_COMMUNITY)
Admission: RE | Admit: 2023-04-16 | Discharge: 2023-04-16 | Disposition: A | Discharge status: Home / Self Care | Payer: Commercial Managed Care - PPO | Attending: Ophthalmology | Admitting: Ophthalmology

## 2023-04-16 ENCOUNTER — Encounter (HOSPITAL_COMMUNITY)
Admission: RE | Disposition: A | Discharge status: Home / Self Care | Payer: Self-pay | Source: Home / Self Care | Attending: Ophthalmology

## 2023-04-16 ENCOUNTER — Other Ambulatory Visit: Payer: Self-pay

## 2023-04-16 ENCOUNTER — Ambulatory Visit (HOSPITAL_COMMUNITY): Payer: Commercial Managed Care - PPO | Admitting: Anesthesiology

## 2023-04-16 ENCOUNTER — Encounter (HOSPITAL_COMMUNITY): Payer: Self-pay | Admitting: Ophthalmology

## 2023-04-16 DIAGNOSIS — F17200 Nicotine dependence, unspecified, uncomplicated: Secondary | ICD-10-CM | POA: Diagnosis not present

## 2023-04-16 DIAGNOSIS — H2522 Age-related cataract, morgagnian type, left eye: Secondary | ICD-10-CM

## 2023-04-16 DIAGNOSIS — H0100B Unspecified blepharitis left eye, upper and lower eyelids: Secondary | ICD-10-CM | POA: Insufficient documentation

## 2023-04-16 DIAGNOSIS — H52223 Regular astigmatism, bilateral: Secondary | ICD-10-CM | POA: Diagnosis not present

## 2023-04-16 DIAGNOSIS — H0100A Unspecified blepharitis right eye, upper and lower eyelids: Secondary | ICD-10-CM | POA: Insufficient documentation

## 2023-04-16 DIAGNOSIS — I10 Essential (primary) hypertension: Secondary | ICD-10-CM | POA: Insufficient documentation

## 2023-04-16 DIAGNOSIS — J449 Chronic obstructive pulmonary disease, unspecified: Secondary | ICD-10-CM | POA: Diagnosis not present

## 2023-04-16 HISTORY — PX: CATARACT EXTRACTION W/PHACO: SHX586

## 2023-04-16 SURGERY — PHACOEMULSIFICATION, CATARACT, WITH IOL INSERTION
Anesthesia: Monitor Anesthesia Care | Site: Eye | Laterality: Left

## 2023-04-16 MED ORDER — POVIDONE-IODINE 5 % OP SOLN
OPHTHALMIC | Status: DC | PRN
  Administered 2023-04-16: 1 via OPHTHALMIC

## 2023-04-16 MED ORDER — LIDOCAINE HCL 3.5 % OP GEL
1.0000 | Freq: Once | OPHTHALMIC | Status: AC
  Administered 2023-04-16: 1 via OPHTHALMIC

## 2023-04-16 MED ORDER — BSS IO SOLN
INTRAOCULAR | Status: DC | PRN
  Administered 2023-04-16: 15 mL via INTRAOCULAR

## 2023-04-16 MED ORDER — TRYPAN BLUE 0.06 % IO SOSY
PREFILLED_SYRINGE | INTRAOCULAR | Status: AC
  Filled 2023-04-16: qty 0.5

## 2023-04-16 MED ORDER — TROPICAMIDE 1 % OP SOLN
1.0000 [drp] | OPHTHALMIC | Status: AC | PRN
  Administered 2023-04-16 (×3): 1 [drp] via OPHTHALMIC

## 2023-04-16 MED ORDER — LIDOCAINE HCL (PF) 1 % IJ SOLN
INTRAOCULAR | Status: DC | PRN
  Administered 2023-04-16: 1 mL via OPHTHALMIC

## 2023-04-16 MED ORDER — SODIUM HYALURONATE 23MG/ML IO SOSY
PREFILLED_SYRINGE | INTRAOCULAR | Status: DC | PRN
  Administered 2023-04-16: .6 mL via INTRAOCULAR

## 2023-04-16 MED ORDER — PHENYLEPHRINE-KETOROLAC 1-0.3 % IO SOLN
INTRAOCULAR | Status: AC
  Filled 2023-04-16: qty 4

## 2023-04-16 MED ORDER — PHENYLEPHRINE HCL 2.5 % OP SOLN
1.0000 [drp] | OPHTHALMIC | Status: AC | PRN
  Administered 2023-04-16 (×3): 1 [drp] via OPHTHALMIC

## 2023-04-16 MED ORDER — STERILE WATER FOR IRRIGATION IR SOLN
Status: DC | PRN
  Administered 2023-04-16: 250 mL

## 2023-04-16 MED ORDER — MOXIFLOXACIN HCL 5 MG/ML IO SOLN
INTRAOCULAR | Status: DC | PRN
  Administered 2023-04-16: .2 mL via INTRACAMERAL

## 2023-04-16 MED ORDER — SODIUM HYALURONATE 10 MG/ML IO SOLUTION
PREFILLED_SYRINGE | INTRAOCULAR | Status: DC | PRN
  Administered 2023-04-16: .85 mL via INTRAOCULAR

## 2023-04-16 MED ORDER — EPINEPHRINE PF 1 MG/ML IJ SOLN
INTRAOCULAR | Status: DC | PRN
  Administered 2023-04-16: 500 mL

## 2023-04-16 MED ORDER — TETRACAINE HCL 0.5 % OP SOLN
1.0000 [drp] | OPHTHALMIC | Status: AC | PRN
  Administered 2023-04-16 (×3): 1 [drp] via OPHTHALMIC

## 2023-04-16 MED ORDER — LACTATED RINGERS IV SOLN: INTRAVENOUS | Status: DC

## 2023-04-16 MED ORDER — EPINEPHRINE PF 1 MG/ML IJ SOLN
INTRAMUSCULAR | Status: AC
  Filled 2023-04-16: qty 1

## 2023-04-16 SURGICAL SUPPLY — 14 items
CATARACT SUITE SIGHTPATH (MISCELLANEOUS) ×1
CLOTH BEACON ORANGE TIMEOUT ST (SAFETY) ×2 IMPLANT
EYE SHIELD UNIVERSAL CLEAR (GAUZE/BANDAGES/DRESSINGS) IMPLANT
FEE CATARACT SUITE SIGHTPATH (MISCELLANEOUS) ×2 IMPLANT
GLOVE BIOGEL PI IND STRL 7.0 (GLOVE) ×4 IMPLANT
LENS IOL TECNIS EYHANCE 22.0 (Intraocular Lens) IMPLANT
NDL HYPO 18GX1.5 BLUNT FILL (NEEDLE) ×2 IMPLANT
NEEDLE HYPO 18GX1.5 BLUNT FILL (NEEDLE) ×1
PAD ARMBOARD 7.5X6 YLW CONV (MISCELLANEOUS) ×2 IMPLANT
POSITIONER HEAD 8X9X4 ADT (SOFTGOODS) ×2 IMPLANT
RING MALYGIN 7.0 (MISCELLANEOUS) IMPLANT
SYR TB 1ML LL NO SAFETY (SYRINGE) ×2 IMPLANT
TAPE SURG TRANSPORE 1 IN (GAUZE/BANDAGES/DRESSINGS) IMPLANT
WATER STERILE IRR 250ML POUR (IV SOLUTION) ×2 IMPLANT

## 2023-04-16 NOTE — Progress Notes (Signed)
#  22 gauge IV removed from left wrist prior to discharge. Tip intact, site clean and dry.

## 2023-04-16 NOTE — Discharge Instructions (Signed)
Please discharge patient when stable, will follow up today with Dr. June Leap at the [facility] office immediately following discharge.  Leave shield in place until visit.  All paperwork with discharge instructions will be given at the office.  [facility] Address:  [address]  [place], [place] [address].UEAV

## 2023-04-16 NOTE — Interval H&P Note (Signed)
History and Physical Interval Note:  04/16/2023 1:43 PM  Diana Hubbard  has presented today for surgery, with the diagnosis of morgagnian age related cataract, left eye.  The various methods of treatment have been discussed with the patient and family. After consideration of risks, benefits and other options for treatment, the patient has consented to  Procedure(s) with comments: CATARACT EXTRACTION PHACO AND INTRAOCULAR LENS PLACEMENT (IOC) (Left) - Complex case as a surgical intervention.  The patient's history has been reviewed, patient examined, no change in status, stable for surgery.  I have reviewed the patient's chart and labs.  Questions were answered to the patient's satisfaction.     [name]

## 2023-04-16 NOTE — Op Note (Signed)
Date of procedure: 04/16/23  Pre-operative diagnosis: Mature, Visually significant age-related cataract, Left Eye (H25.22)  Post-operative diagnosis: Mature Visually significant age-related cataract, Left Eye  Procedure: Complex Removal of cataract via phacoemulsification and insertion of intra-ocular lens [employer] DIB00 +22.0D into the capsular bag of the Left Eye  Attending surgeon: Rudy Jew. Leana Springston, MD, MA  Anesthesia: MAC, Topical Akten  Complications: None  Estimated Blood Loss: <15mL (minimal)  Specimens: None  Implants: As above  Indications:  Mature Visually significant age-related cataract, Left Eye  Procedure:  The patient was seen and identified in the pre-operative area. The operative eye was identified and dilated.  The operative eye was marked.  Topical anesthesia was administered to the operative eye.     The patient was then to the operative suite and placed in the supine position.  A timeout was performed confirming the patient, procedure to be performed, and all other relevant information.   The patient's face was prepped and draped in the usual fashion for intra-ocular surgery.  A lid speculum was placed into the operative eye and the surgical microscope moved into place and focused.  A lack of red reflex due to a mature cataract was confirmed.  An inferotemporal paracentesis was created using a 20 gauge paracentesis blade.  Vision blue was injected into the anterior chamber.  Shugarcaine was injected into the anterior chamber.  Viscoelastic was injected into the anterior chamber.  A temporal clear-corneal main wound incision was created using a 2.41mm microkeratome.  A continuous curvilinear capsulorrhexis was initiated using an irrigating cystitome and completed using capsulorrhexis forceps.  Hydrodissection and hydrodeliniation were performed.  Viscoelastic was injected into the anterior chamber.  A phacoemulsification handpiece and a chopper as a second  instrument were used to remove the nucleus and epinucleus. The irrigation/aspiration handpiece was used to remove any remaining cortical material.   The capsular bag was reinflated with viscoelastic, checked, and found to be intact. The intraocular lens was inserted into the capsular bag and dialed into place using a kuglen hook.  The irrigation/aspiration handpiece was used to remove any remaining viscoelastic.  The clear corneal wound and paracentesis wounds were then hydrated and checked with Weck-Cels to be watertight. 0.63mL of moxifloxacin was injected into the anterior chamber. The lid-speculum and drape was removed, and the patient's face was cleaned with a wet and dry 4x4. A clear shield was taped over the eye. The patient was taken to the post-operative care unit in good condition, having tolerated the procedure well.  Post-Op Instructions: The patient will follow up at [facility] for a same day post-operative evaluation and will receive all other orders and instructions.

## 2023-04-16 NOTE — Anesthesia Preprocedure Evaluation (Signed)
Anesthesia Evaluation  Patient identified by MRN, date of birth, ID band Patient awake    Reviewed: Allergy & Precautions, H&P , NPO status , Patient's Chart, lab work & pertinent test results, reviewed documented beta blocker date and time   Airway Mallampati: II  TM Distance: >3 FB Neck ROM: full    Dental no notable dental hx.    Pulmonary neg pulmonary ROS, COPD, Current Smoker and Patient abstained from smoking.   Pulmonary exam normal breath sounds clear to auscultation       Cardiovascular Exercise Tolerance: Good hypertension, negative cardio ROS  Rhythm:regular Rate:Normal     Neuro/Psych negative neurological ROS  negative psych ROS   GI/Hepatic negative GI ROS, Neg liver ROS,,,  Endo/Other  negative endocrine ROS    Renal/GU Renal diseasenegative Renal ROS  negative genitourinary   Musculoskeletal   Abdominal   Peds  Hematology negative hematology ROS (+)   Anesthesia Other Findings   Reproductive/Obstetrics negative OB ROS                             Anesthesia Physical Anesthesia Plan  ASA: 3  Anesthesia Plan: General and MAC   Post-op Pain Management:    Induction:   PONV Risk Score and Plan:   Airway Management Planned:   Additional Equipment:   Intra-op Plan:   Post-operative Plan:   Informed Consent: I have reviewed the patients History and Physical, chart, labs and discussed the procedure including the risks, benefits and alternatives for the proposed anesthesia with the patient or authorized representative who has indicated his/her understanding and acceptance.     Dental Advisory Given  Plan Discussed with: CRNA  Anesthesia Plan Comments:        Anesthesia Quick Evaluation

## 2023-04-17 NOTE — Transfer of Care (Signed)
Immediate Anesthesia Transfer of Care Note  Patient: Diana Hubbard  Procedure(s) Performed: CATARACT EXTRACTION PHACO AND INTRAOCULAR LENS PLACEMENT (IOC) (Left: Eye)  Patient Location: Short Stay  Anesthesia Type:MAC  Level of Consciousness: awake, alert , oriented, and patient cooperative  Airway & Oxygen Therapy: Patient Spontanous Breathing  Post-op Assessment: Report given to RN, Post -op Vital signs reviewed and stable, and Patient moving all extremities X 4  Post vital signs: Reviewed and stable  Last Vitals:  Vitals Value Taken Time  BP 145/87 04/16/23 1443  Temp 36.5 C 04/16/23 1443  Pulse 98 04/16/23 1443  Resp 14 04/16/23 1443  SpO2 98 % 04/16/23 1443    Last Pain:  Vitals:   04/16/23 1443  TempSrc: Oral  PainSc: 0-No pain         Complications: No notable events documented.

## 2023-04-18 ENCOUNTER — Encounter (HOSPITAL_COMMUNITY): Payer: Self-pay | Admitting: Ophthalmology

## 2023-04-20 ENCOUNTER — Encounter (HOSPITAL_COMMUNITY): Payer: Self-pay | Admitting: Ophthalmology

## 2023-04-20 NOTE — Anesthesia Postprocedure Evaluation (Signed)
Anesthesia Post Note  Patient: Diana Hubbard  Procedure(s) Performed: CATARACT EXTRACTION PHACO AND INTRAOCULAR LENS PLACEMENT (IOC) (Left: Eye)  Patient location during evaluation: Phase II Anesthesia Type: MAC Level of consciousness: awake Pain management: pain level controlled Vital Signs Assessment: post-procedure vital signs reviewed and stable Respiratory status: spontaneous breathing and respiratory function stable Cardiovascular status: blood pressure returned to baseline and stable Postop Assessment: no headache and no apparent nausea or vomiting Anesthetic complications: no Comments: Late entry   No notable events documented.   Last Vitals:  Vitals:   04/16/23 1324 04/16/23 1443  BP: (!) 161/82 (!) 145/87  Pulse: 96 98  Resp: 12 14  Temp: 36.6 C 36.5 C  SpO2: 99% 98%    Last Pain:  Vitals:   04/16/23 1443  TempSrc: Oral  PainSc: 0-No pain                 Windell Norfolk

## 2023-04-27 NOTE — Plan of Care (Signed)
 [facility] Tonsillectomy/Adenoidectomy, Postoperative PEDS care plan entered in error.

## 2023-05-10 ENCOUNTER — Encounter (HOSPITAL_COMMUNITY)
Admission: RE | Admit: 2023-05-10 | Discharge: 2023-05-10 | Disposition: A | Discharge status: Home / Self Care | Payer: Commercial Managed Care - PPO | Source: Ambulatory Visit | Attending: Ophthalmology | Admitting: Ophthalmology

## 2023-05-10 IMAGING — MG MM DIGITAL SCREENING BILAT W/ TOMO AND CAD
6 of 10 series · 6 of 30 positions shown · non-contrast
Comparison: Previous exam(s).

CLINICAL DATA: Screening.

EXAM:
DIGITAL SCREENING BILATERAL MAMMOGRAM WITH TOMOSYNTHESIS AND CAD
TECHNIQUE: Bilateral screening digital craniocaudal and mediolateral oblique
mammograms were obtained. Bilateral screening digital breast
tomosynthesis was performed. The images were evaluated with
computer-aided detection.

[L CC synth-2D]
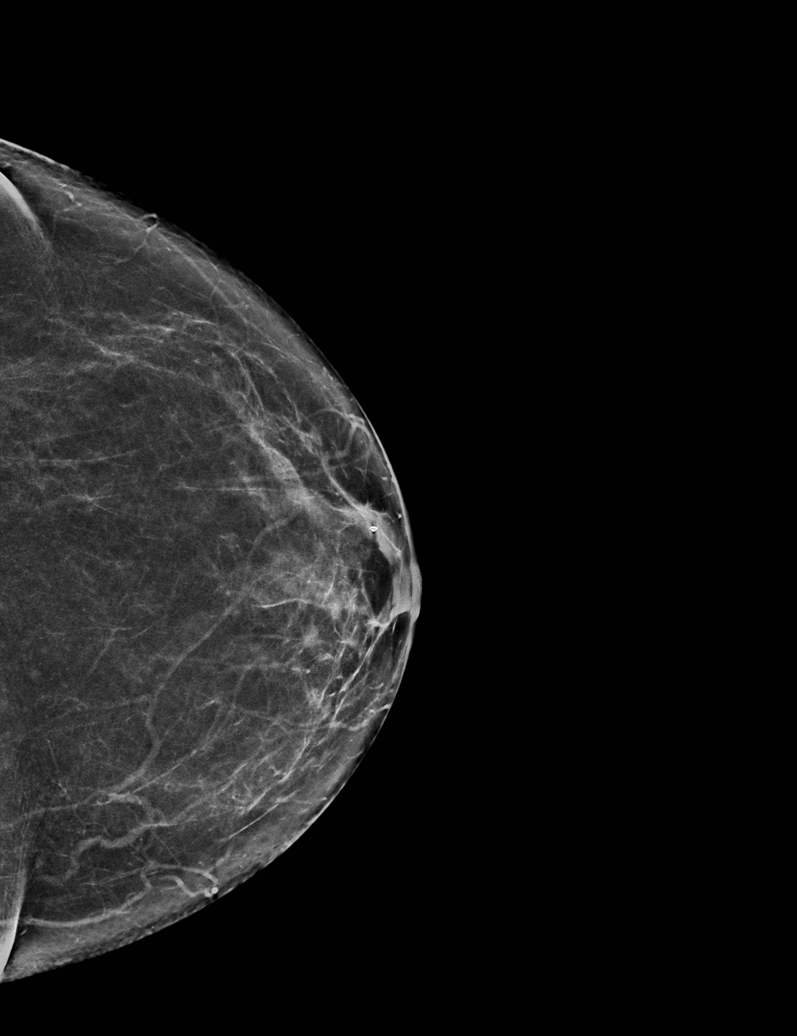

[R CC synth-2D]
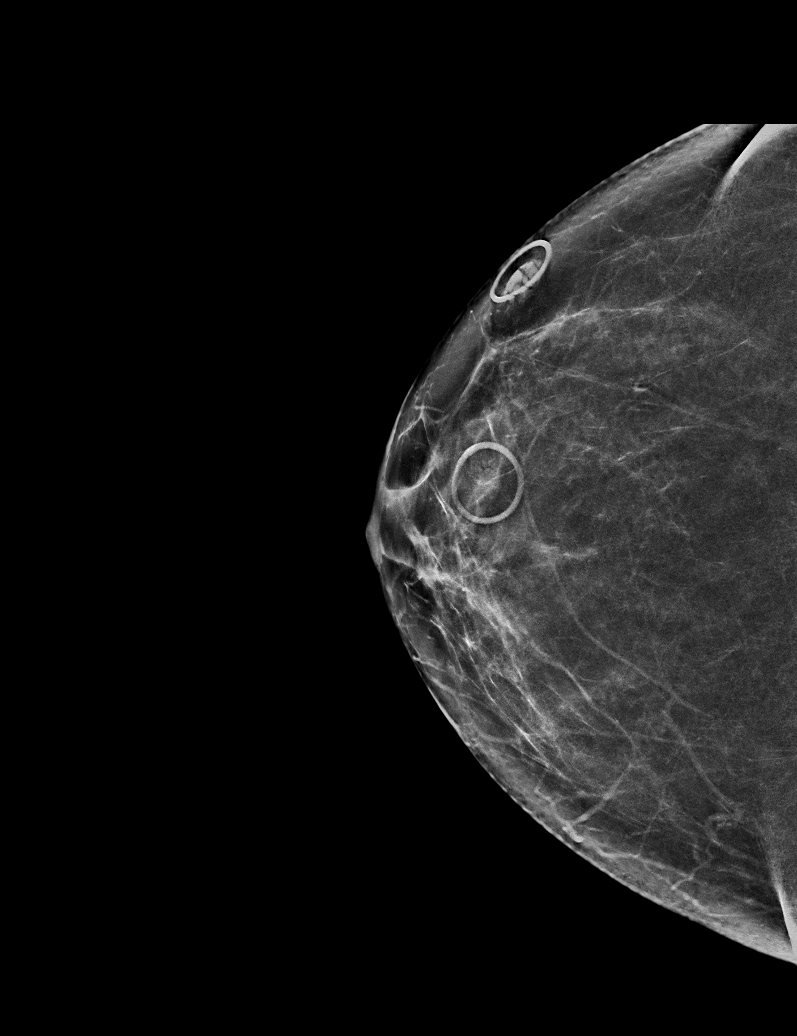

[L MLO synth-2D]
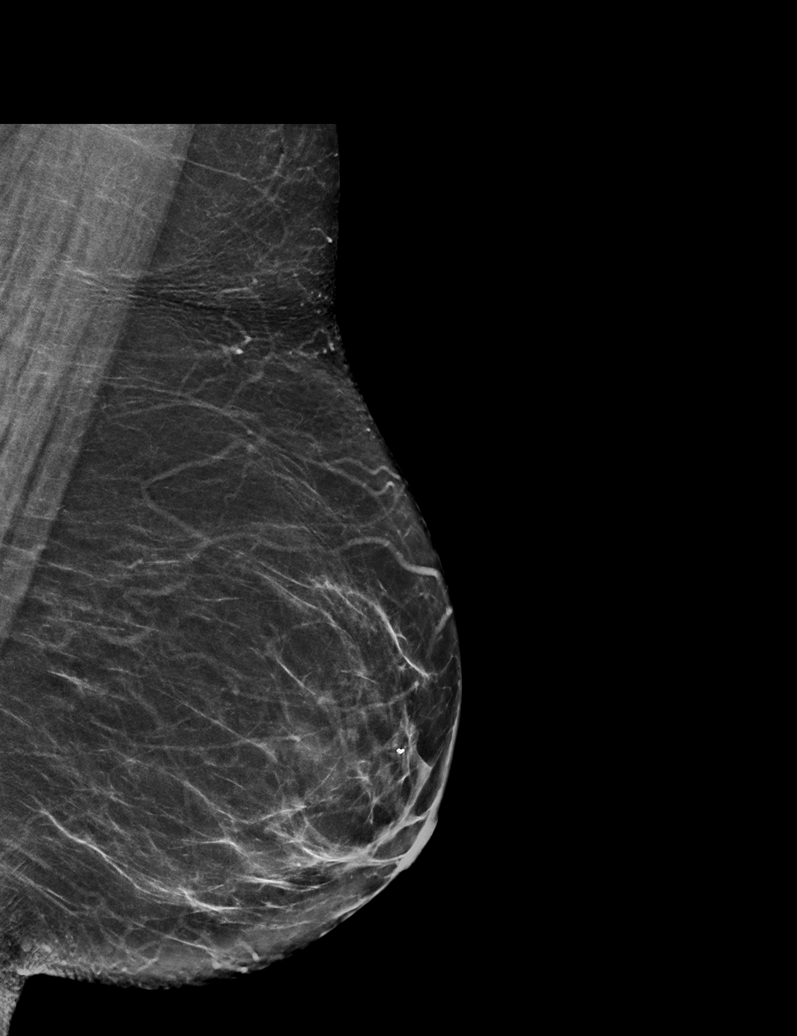

[R MLO synth-2D (1 of 2)]
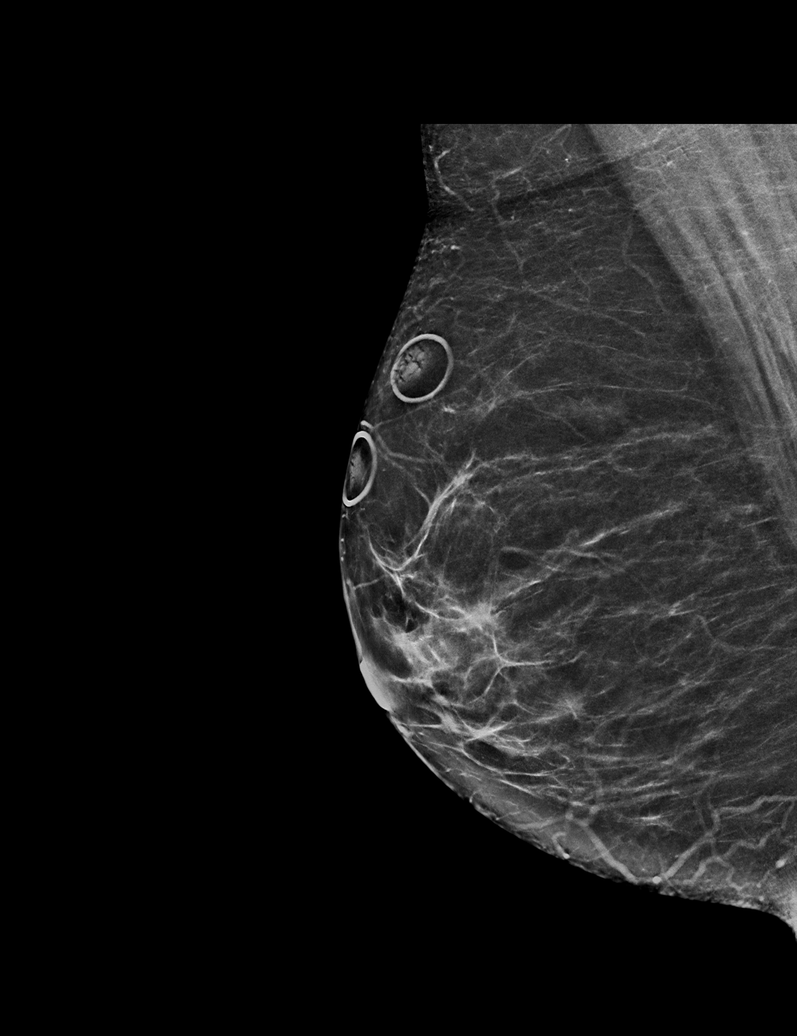

[R MLO synth-2D (2 of 2)]
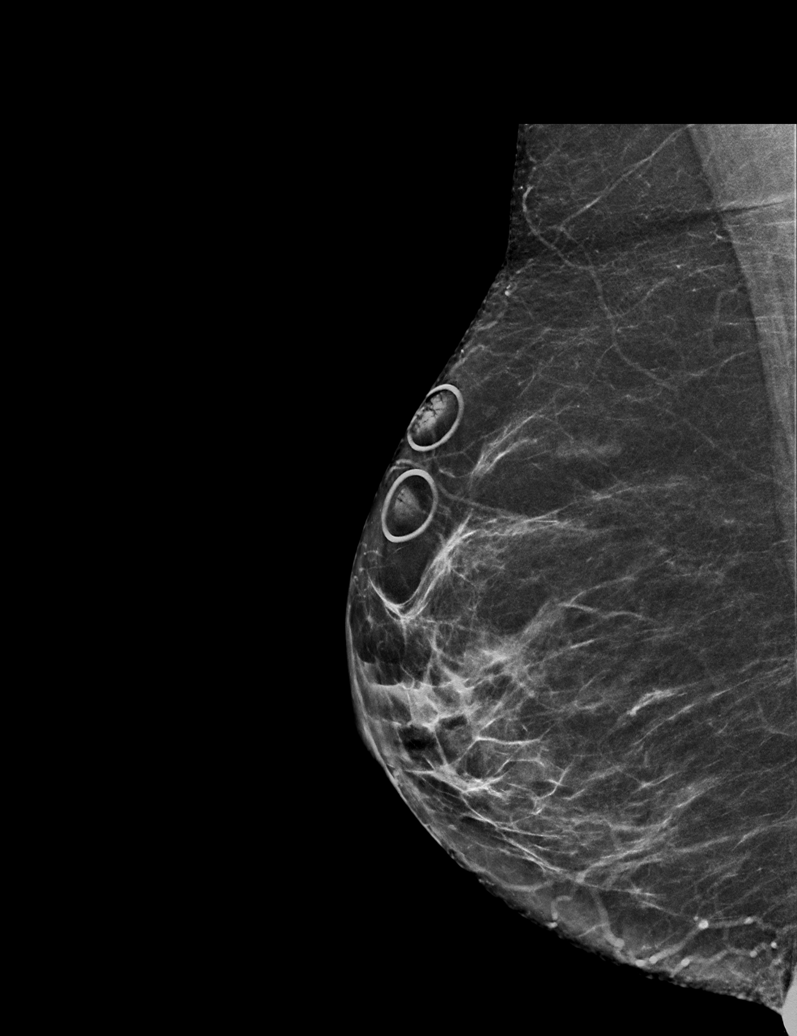

[L MLO tomo · tomo slice 34/67.0]
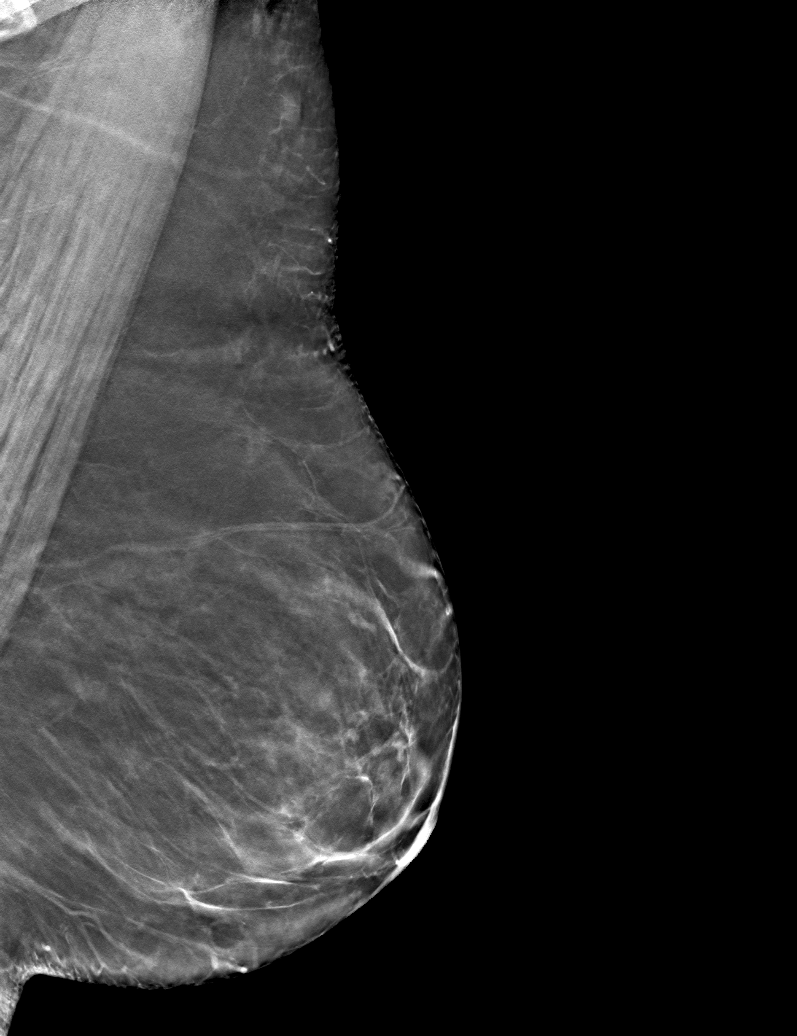

[6 of 30 positions shown; findings below may reference images not displayed]

ACR Breast Density Category b: There are scattered areas of
fibroglandular density.
FINDINGS: There are no findings suspicious for malignancy.
IMPRESSION: No mammographic evidence of malignancy. A result letter of this
screening mammogram will be mailed directly to the patient.

RECOMMENDATION:
Screening mammogram in one year. (Code:51-O-LD2)

BI-RADS CATEGORY  1: Negative.

## 2023-05-11 NOTE — H&P (Signed)
Surgical History & Physical  Patient Name: Diana Hubbard  DOB: 09-23-1955  Surgery: Cataract extraction with intraocular lens implant phacoemulsification; Right Eye Surgeon: [name] MD Surgery Date: 05/14/2023 Pre-Op Date: 04/23/2023  HPI: A 8 Yr. old female patient 1. The patient is returning for a cataract follow-up of the left eye. Since the last visit, the affected area is doing well. The patient's vision is improved. The condition's severity is constant. Patient is following medication instructions. Patient also here for blurry vision in the right eye. This has been present for several months. She has trouble reading up close and with glare driving at night. The blurry vision is constant. There is a large difference between the two eyes. This is negatively affecting the patient's quality of life and the patient is unable to function adequately in life with the current level of vision. Pt. is ready to proceed with surgery on OD, due to having blurred vision. HPI was performed by [name] .  Medical History:  High Blood Pressure LDL  Review of Systems Negative Allergic/Immunologic HTN Cardiovascular Negative Constitutional Negative Ear, Nose, Mouth & Throat Negative Endocrine Negative Eyes Negative Gastrointestinal Negative Genitourinary Negative Hemotologic/Lymphatic Negative Integumentary Negative Musculoskeletal Negative Neurological Negative Psychiatry Negative Respiratory  Social Current every day smoker  Medication Prednisolone-moxiflox-bromfen,  atorvastatin ,  amlodipine ,  losartan-hydrochlorothiazide ,  ibandronate ,  Farxiga ,  cephalexin   Sx/Procedures Phaco c IOL OS,  Gallbladder, Appendectomy  Drug Allergies  ACE Inhibitors    History & Physical: Heent: cataract  NECK: supple without bruits LUNGS: lungs clear to auscultation CV: regular rate and rhythm Abdomen: soft and non-tender  Impression & Plan: Assessment: 1.  CATARACT  EXTRACTION STATUS; Left Eye (Z98.42) 2.  INTRAOCULAR LENS IOL ; Left Eye (Z96.1) 3.  COMBINED FORMS AGE RELATED CATARACT; Right Eye (H25.811) 4.  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  1 week after cataract surgery. Doing well with improved vision and normal eye pressure. Call with any problems or concerns. Continue Pred-Moxi-Brom 2x/day for 3 more weeks.  2.  Doing well since surgery Continue Post-op medications  3.  Cataract accounts for the patient's decreased vision. This visual impairment is not correctable with a tolerable change in glasses or contact lenses. Cataract surgery with an implantation of a new lens should significantly improve the visual and functional status of the patient. Discussed all risks, benefits, alternatives, and potential complications. Discussed the procedures and recovery. Patient desires to have surgery. A-scan ordered and performed today for intra-ocular lens calculations. The surgery will be performed in order to improve vision for driving, reading, and for eye examinations. Recommend phacoemulsification with intra-ocular lens. Recommend Dextenza for post-operative pain and inflammation. Right Eye. Surgery required to correct imbalance of vision. Dilates well - shugarcaine by protocol.  4.  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 Preservative Free Artificial tears 1 drop 2-3x/day. Monitor.

## 2023-05-14 ENCOUNTER — Ambulatory Visit (HOSPITAL_COMMUNITY)
Admission: RE | Admit: 2023-05-14 | Discharge: 2023-05-14 | Disposition: A | Discharge status: Home / Self Care | Payer: Commercial Managed Care - PPO | Attending: Ophthalmology | Admitting: Ophthalmology

## 2023-05-14 ENCOUNTER — Ambulatory Visit (HOSPITAL_COMMUNITY): Payer: Commercial Managed Care - PPO | Admitting: Certified Registered"

## 2023-05-14 ENCOUNTER — Encounter (HOSPITAL_COMMUNITY)
Admission: RE | Disposition: A | Discharge status: Home / Self Care | Payer: Self-pay | Source: Home / Self Care | Attending: Ophthalmology

## 2023-05-14 ENCOUNTER — Encounter (HOSPITAL_COMMUNITY): Payer: Self-pay | Admitting: Ophthalmology

## 2023-05-14 DIAGNOSIS — Z9842 Cataract extraction status, left eye: Secondary | ICD-10-CM | POA: Insufficient documentation

## 2023-05-14 DIAGNOSIS — Z961 Presence of intraocular lens: Secondary | ICD-10-CM | POA: Diagnosis not present

## 2023-05-14 DIAGNOSIS — H18593 Other hereditary corneal dystrophies, bilateral: Secondary | ICD-10-CM | POA: Diagnosis not present

## 2023-05-14 DIAGNOSIS — I129 Hypertensive chronic kidney disease with stage 1 through stage 4 chronic kidney disease, or unspecified chronic kidney disease: Secondary | ICD-10-CM

## 2023-05-14 DIAGNOSIS — F17200 Nicotine dependence, unspecified, uncomplicated: Secondary | ICD-10-CM | POA: Diagnosis not present

## 2023-05-14 DIAGNOSIS — H25811 Combined forms of age-related cataract, right eye: Secondary | ICD-10-CM | POA: Insufficient documentation

## 2023-05-14 DIAGNOSIS — I10 Essential (primary) hypertension: Secondary | ICD-10-CM | POA: Insufficient documentation

## 2023-05-14 DIAGNOSIS — J449 Chronic obstructive pulmonary disease, unspecified: Secondary | ICD-10-CM | POA: Diagnosis not present

## 2023-05-14 DIAGNOSIS — N189 Chronic kidney disease, unspecified: Secondary | ICD-10-CM | POA: Diagnosis not present

## 2023-05-14 HISTORY — PX: CATARACT EXTRACTION W/PHACO: SHX586

## 2023-05-14 SURGERY — PHACOEMULSIFICATION, CATARACT, WITH IOL INSERTION
Anesthesia: Monitor Anesthesia Care | Site: Eye | Laterality: Right

## 2023-05-14 MED ORDER — EPINEPHRINE PF 1 MG/ML IJ SOLN
INTRAOCULAR | Status: DC | PRN
  Administered 2023-05-14: 500 mL

## 2023-05-14 MED ORDER — MOXIFLOXACIN HCL 5 MG/ML IO SOLN
INTRAOCULAR | Status: DC | PRN
  Administered 2023-05-14: .3 mL via OPHTHALMIC

## 2023-05-14 MED ORDER — STERILE WATER FOR IRRIGATION IR SOLN
Status: DC | PRN
  Administered 2023-05-14: 1

## 2023-05-14 MED ORDER — LIDOCAINE HCL 3.5 % OP GEL
1.0000 | Freq: Once | OPHTHALMIC | Status: AC
  Administered 2023-05-14: 1 via OPHTHALMIC

## 2023-05-14 MED ORDER — PHENYLEPHRINE HCL 2.5 % OP SOLN
1.0000 [drp] | OPHTHALMIC | Status: AC | PRN
  Administered 2023-05-14 (×3): 1 [drp] via OPHTHALMIC

## 2023-05-14 MED ORDER — TROPICAMIDE 1 % OP SOLN
1.0000 [drp] | OPHTHALMIC | Status: AC | PRN
  Administered 2023-05-14 (×3): 1 [drp] via OPHTHALMIC

## 2023-05-14 MED ORDER — POVIDONE-IODINE 5 % OP SOLN
OPHTHALMIC | Status: DC | PRN
  Administered 2023-05-14: 1 via OPHTHALMIC

## 2023-05-14 MED ORDER — LIDOCAINE HCL (PF) 1 % IJ SOLN
INTRAOCULAR | Status: DC | PRN
  Administered 2023-05-14: 1 mL via OPHTHALMIC

## 2023-05-14 MED ORDER — SODIUM HYALURONATE 10 MG/ML IO SOLUTION
PREFILLED_SYRINGE | INTRAOCULAR | Status: DC | PRN
  Administered 2023-05-14: .85 mL via INTRAOCULAR

## 2023-05-14 MED ORDER — BSS IO SOLN
INTRAOCULAR | Status: DC | PRN
  Administered 2023-05-14: 15 mL via INTRAOCULAR

## 2023-05-14 MED ORDER — SODIUM HYALURONATE 23MG/ML IO SOSY
PREFILLED_SYRINGE | INTRAOCULAR | Status: DC | PRN
  Administered 2023-05-14: .6 mL via INTRAOCULAR

## 2023-05-14 MED ORDER — TETRACAINE HCL 0.5 % OP SOLN
1.0000 [drp] | OPHTHALMIC | Status: AC | PRN
  Administered 2023-05-14 (×3): 1 [drp] via OPHTHALMIC

## 2023-05-14 SURGICAL SUPPLY — 13 items
CATARACT SUITE SIGHTPATH (MISCELLANEOUS) ×1
CLOTH BEACON ORANGE TIMEOUT ST (SAFETY) ×2 IMPLANT
EYE SHIELD UNIVERSAL CLEAR (GAUZE/BANDAGES/DRESSINGS) IMPLANT
FEE CATARACT SUITE SIGHTPATH (MISCELLANEOUS) ×2 IMPLANT
GLOVE BIOGEL PI IND STRL 7.0 (GLOVE) ×4 IMPLANT
LENS IOL TECNIS EYHANCE 21.0 (Intraocular Lens) IMPLANT
NDL HYPO 18GX1.5 BLUNT FILL (NEEDLE) ×2 IMPLANT
NEEDLE HYPO 18GX1.5 BLUNT FILL (NEEDLE) ×1
PAD ARMBOARD 7.5X6 YLW CONV (MISCELLANEOUS) ×2 IMPLANT
POSITIONER HEAD 8X9X4 ADT (SOFTGOODS) ×2 IMPLANT
SYR TB 1ML LL NO SAFETY (SYRINGE) ×2 IMPLANT
TAPE SURG TRANSPORE 1 IN (GAUZE/BANDAGES/DRESSINGS) IMPLANT
WATER STERILE IRR 250ML POUR (IV SOLUTION) ×2 IMPLANT

## 2023-05-14 NOTE — Transfer of Care (Addendum)
Immediate Anesthesia Transfer of Care Note  Patient: Diana Hubbard  Procedure(s) Performed: CATARACT EXTRACTION PHACO AND INTRAOCULAR LENS PLACEMENT (IOC) (Right: Eye)  Patient Location: Short Stay  Anesthesia Type:MAC  Level of Consciousness: awake and patient cooperative  Airway & Oxygen Therapy: Patient Spontanous Breathing  Post-op Assessment: Report given to RN and Post -op Vital signs reviewed and stable  Post vital signs: Reviewed and stable  Last Vitals:  Vitals Value Taken Time  BP 151/85 05/14/23   0838  Temp 36.7 05/14/23   0837  Pulse    Resp 12 05/14/23   0837  SpO2 98% 05/14/23   0837    Last Pain:  Vitals:   05/14/23 0745  TempSrc: Oral  PainSc: 0-No pain         Complications: No notable events documented.

## 2023-05-14 NOTE — Op Note (Signed)
Date of procedure: 05/14/23  Pre-operative diagnosis:  Visually significant combined form age-related cataract, Right Eye (H25.811)  Post-operative diagnosis:  Visually significant combined form age-related cataract, Right Eye (H25.811)  Procedure: Removal of cataract via phacoemulsification and insertion of intra-ocular lens [employer] DIB00 +21.0D into the capsular bag of the Right Eye  Attending surgeon: Rudy Jew. Calley Drenning, MD, MA  Anesthesia: MAC, Topical Akten  Complications: None  Estimated Blood Loss: <60mL (minimal)  Specimens: None  Implants: As above  Indications:  Visually significant age-related cataract, Right Eye  Procedure:  The patient was seen and identified in the pre-operative area. The operative eye was identified and dilated.  The operative eye was marked.  Topical anesthesia was administered to the operative eye.     The patient was then to the operative suite and placed in the supine position.  A timeout was performed confirming the patient, procedure to be performed, and all other relevant information.   The patient's face was prepped and draped in the usual fashion for intra-ocular surgery.  A lid speculum was placed into the operative eye and the surgical microscope moved into place and focused.  A superotemporal paracentesis was created using a 20 gauge paracentesis blade.  Shugarcaine was injected into the anterior chamber.  Viscoelastic was injected into the anterior chamber.  A temporal clear-corneal main wound incision was created using a 2.29mm microkeratome.  A continuous curvilinear capsulorrhexis was initiated using an irrigating cystitome and completed using capsulorrhexis forceps.  Hydrodissection and hydrodeliniation were performed.  Viscoelastic was injected into the anterior chamber.  A phacoemulsification handpiece and a chopper as a second instrument were used to remove the nucleus and epinucleus. The irrigation/aspiration handpiece was used to  remove any remaining cortical material.   The capsular bag was reinflated with viscoelastic, checked, and found to be intact.  The intraocular lens was inserted into the capsular bag.  The irrigation/aspiration handpiece was used to remove any remaining viscoelastic.  The clear corneal wound and paracentesis wounds were then hydrated and checked with Weck-Cels to be watertight. 0.49mL of Moxfloxacin was injected into the anterior chamber. The lid-speculum was removed.  The drape was removed.  The patient's face was cleaned with a wet and dry 4x4. A clear shield was taped over the eye. The patient was taken to the post-operative care unit in good condition, having tolerated the procedure well.  Post-Op Instructions: The patient will follow up at [facility] for a same day post-operative evaluation and will receive all other orders and instructions.

## 2023-05-14 NOTE — Interval H&P Note (Signed)
History and Physical Interval Note:  05/14/2023 8:15 AM  Diana Hubbard  has presented today for surgery, with the diagnosis of combined forms age related cataract, right eye.  The various methods of treatment have been discussed with the patient and family. After consideration of risks, benefits and other options for treatment, the patient has consented to  Procedure(s): CATARACT EXTRACTION PHACO AND INTRAOCULAR LENS PLACEMENT (IOC) (Right) as a surgical intervention.  The patient's history has been reviewed, patient examined, no change in status, stable for surgery.  I have reviewed the patient's chart and labs.  Questions were answered to the patient's satisfaction.     [name]

## 2023-05-14 NOTE — Anesthesia Preprocedure Evaluation (Signed)
Anesthesia Evaluation  Patient identified by MRN, date of birth, ID band Patient awake    Reviewed: Allergy & Precautions, H&P , NPO status , Patient's Chart, lab work & pertinent test results, reviewed documented beta blocker date and time   Airway Mallampati: II  TM Distance: >3 FB Neck ROM: full    Dental no notable dental hx. (+) Dental Advisory Given, Teeth Intact   Pulmonary neg pulmonary ROS, COPD, Current Smoker and Patient abstained from smoking.   Pulmonary exam normal breath sounds clear to auscultation       Cardiovascular Exercise Tolerance: Good hypertension, negative cardio ROS Normal cardiovascular exam Rhythm:regular Rate:Normal     Neuro/Psych negative neurological ROS  negative psych ROS   GI/Hepatic negative GI ROS, Neg liver ROS,,,  Endo/Other  negative endocrine ROS    Renal/GU Renal diseasenegative Renal ROS  negative genitourinary   Musculoskeletal   Abdominal   Peds  Hematology negative hematology ROS (+)   Anesthesia Other Findings   Reproductive/Obstetrics negative OB ROS                             Anesthesia Physical Anesthesia Plan  ASA: 3  Anesthesia Plan: MAC   Post-op Pain Management:    Induction:   PONV Risk Score and Plan:   Airway Management Planned: Nasal Cannula  Additional Equipment: None  Intra-op Plan:   Post-operative Plan:   Informed Consent: I have reviewed the patients History and Physical, chart, labs and discussed the procedure including the risks, benefits and alternatives for the proposed anesthesia with the patient or authorized representative who has indicated his/her understanding and acceptance.     Dental Advisory Given  Plan Discussed with: CRNA  Anesthesia Plan Comments:        Anesthesia Quick Evaluation

## 2023-05-14 NOTE — Discharge Instructions (Signed)
Please discharge patient when stable, will follow up today with Dr. Carnella Fryman at the [facility] [place] office immediately following discharge.  Leave shield in place until visit.  All paperwork with discharge instructions will be given at the office.  [facility] Address:  [address]  [place], [place] [address]  

## 2023-05-14 NOTE — Anesthesia Postprocedure Evaluation (Signed)
Anesthesia Post Note  Patient: [name]  Procedure(s) Performed: CATARACT EXTRACTION PHACO AND INTRAOCULAR LENS PLACEMENT (IOC) (Right: Eye)  Patient location during evaluation: PACU Anesthesia Type: MAC Level of consciousness: awake and alert Pain management: pain level controlled Vital Signs Assessment: post-procedure vital signs reviewed and stable Respiratory status: spontaneous breathing, nonlabored ventilation, respiratory function stable and patient connected to nasal cannula oxygen Cardiovascular status: stable and blood pressure returned to baseline Postop Assessment: no apparent nausea or vomiting Anesthetic complications: no   There were no known notable events for this encounter.   Last Vitals:  Vitals:   05/14/23 0837 05/14/23 0838  BP:  (!) 151/85  Pulse:    Resp: 12   Temp: 36.7 C   SpO2: 98%     Last Pain:  Vitals:   05/14/23 0837  TempSrc: Oral  PainSc:                  Gaetano Hawthorne

## 2023-05-16 ENCOUNTER — Encounter (HOSPITAL_COMMUNITY): Payer: Self-pay | Admitting: Ophthalmology

## 2023-05-29 ENCOUNTER — Ambulatory Visit: Payer: Commercial Managed Care - PPO | Admitting: Internal Medicine

## 2023-06-28 ENCOUNTER — Ambulatory Visit: Payer: Commercial Managed Care - PPO | Admitting: Internal Medicine

## 2023-08-07 ENCOUNTER — Other Ambulatory Visit: Payer: Self-pay | Admitting: Internal Medicine

## 2023-08-07 DIAGNOSIS — I10 Essential (primary) hypertension: Secondary | ICD-10-CM

## 2023-08-14 ENCOUNTER — Other Ambulatory Visit: Payer: Self-pay | Admitting: Internal Medicine

## 2023-08-14 DIAGNOSIS — I10 Essential (primary) hypertension: Secondary | ICD-10-CM

## 2023-08-17 ENCOUNTER — Other Ambulatory Visit: Payer: Self-pay | Admitting: Internal Medicine

## 2023-08-17 DIAGNOSIS — N1832 Chronic kidney disease, stage 3b: Secondary | ICD-10-CM

## 2023-09-04 ENCOUNTER — Encounter: Payer: Commercial Managed Care - PPO | Admitting: Internal Medicine

## 2023-11-04 ENCOUNTER — Other Ambulatory Visit: Payer: Self-pay | Admitting: Internal Medicine

## 2023-11-04 DIAGNOSIS — E782 Mixed hyperlipidemia: Secondary | ICD-10-CM

## 2023-11-05 ENCOUNTER — Other Ambulatory Visit: Payer: Self-pay | Admitting: Internal Medicine

## 2023-11-05 DIAGNOSIS — M810 Age-related osteoporosis without current pathological fracture: Secondary | ICD-10-CM

## 2023-12-14 ENCOUNTER — Telehealth: Payer: Self-pay

## 2023-12-14 ENCOUNTER — Other Ambulatory Visit: Payer: Self-pay | Admitting: Internal Medicine

## 2023-12-14 DIAGNOSIS — N2581 Secondary hyperparathyroidism of renal origin: Secondary | ICD-10-CM

## 2023-12-14 DIAGNOSIS — I700 Atherosclerosis of aorta: Secondary | ICD-10-CM

## 2023-12-14 DIAGNOSIS — I10 Essential (primary) hypertension: Secondary | ICD-10-CM

## 2023-12-14 DIAGNOSIS — R739 Hyperglycemia, unspecified: Secondary | ICD-10-CM

## 2023-12-14 DIAGNOSIS — N1832 Chronic kidney disease, stage 3b: Secondary | ICD-10-CM

## 2023-12-14 DIAGNOSIS — E559 Vitamin D deficiency, unspecified: Secondary | ICD-10-CM

## 2023-12-14 DIAGNOSIS — E782 Mixed hyperlipidemia: Secondary | ICD-10-CM

## 2023-12-14 NOTE — Telephone Encounter (Signed)
 Reason for CRM: Patient is calling to see if she needs new labs done before her appt her last labs are from 10/25 she is stating that she can have them done at work if the request is sent to them. Please contact patient to advise.

## 2023-12-17 NOTE — Telephone Encounter (Signed)
 Orders e-mailed on 6/13 to [email or web address]

## 2023-12-27 ENCOUNTER — Encounter: Payer: Self-pay | Admitting: Emergency Medicine

## 2023-12-27 ENCOUNTER — Ambulatory Visit (INDEPENDENT_AMBULATORY_CARE_PROVIDER_SITE_OTHER): Admitting: Internal Medicine

## 2023-12-27 ENCOUNTER — Other Ambulatory Visit: Payer: Self-pay | Admitting: Internal Medicine

## 2023-12-27 ENCOUNTER — Encounter: Payer: Self-pay | Admitting: Internal Medicine

## 2023-12-27 VITALS — BP 128/80 | HR 112 | Ht 68.0 in | Wt 152.4 lb

## 2023-12-27 DIAGNOSIS — Z0001 Encounter for general adult medical examination with abnormal findings: Secondary | ICD-10-CM

## 2023-12-27 DIAGNOSIS — J432 Centrilobular emphysema: Secondary | ICD-10-CM

## 2023-12-27 DIAGNOSIS — F17210 Nicotine dependence, cigarettes, uncomplicated: Secondary | ICD-10-CM

## 2023-12-27 DIAGNOSIS — M810 Age-related osteoporosis without current pathological fracture: Secondary | ICD-10-CM

## 2023-12-27 DIAGNOSIS — Z1231 Encounter for screening mammogram for malignant neoplasm of breast: Secondary | ICD-10-CM

## 2023-12-27 DIAGNOSIS — N1832 Chronic kidney disease, stage 3b: Secondary | ICD-10-CM

## 2023-12-27 DIAGNOSIS — Z122 Encounter for screening for malignant neoplasm of respiratory organs: Secondary | ICD-10-CM

## 2023-12-27 DIAGNOSIS — Z720 Tobacco use: Secondary | ICD-10-CM

## 2023-12-27 DIAGNOSIS — I10 Essential (primary) hypertension: Secondary | ICD-10-CM

## 2023-12-27 DIAGNOSIS — N2581 Secondary hyperparathyroidism of renal origin: Secondary | ICD-10-CM

## 2023-12-27 DIAGNOSIS — J301 Allergic rhinitis due to pollen: Secondary | ICD-10-CM

## 2023-12-27 MED ORDER — DAPAGLIFLOZIN PROPANEDIOL 10 MG PO TABS: 10.0000 mg | ORAL_TABLET | Freq: Every day | ORAL | 5 refills | Status: DC

## 2023-12-27 MED ORDER — LOSARTAN POTASSIUM-HCTZ 100-25 MG PO TABS: 1.0000 | ORAL_TABLET | Freq: Every day | ORAL | 1 refills | Status: DC

## 2023-12-27 MED ORDER — FLUTICASONE PROPIONATE 50 MCG/ACT NA SUSP: 2.0000 | Freq: Every day | NASAL | 1 refills | Status: DC

## 2023-12-27 NOTE — Assessment & Plan Note (Signed)
Had headache with alendronate (?) On Ibandronate now, tolerates it well Continue vitamin D supplement

## 2023-12-27 NOTE — Assessment & Plan Note (Signed)
 Likely due to CKD PTH: 85 -> 78 -> 31 now Needs to continue Vitamin D supplement, Ca wnl If persistently elevated PTH, can add calcitriol

## 2023-12-27 NOTE — Patient Instructions (Signed)
 Please start taking Vitamin D 2000 IU once daily.  Please continue to take medications as prescribed.  Please continue to follow low salt diet and perform moderate exercise/walking at least 150 mins/week.  Please get blood tests done before the next visit.

## 2023-12-27 NOTE — Assessment & Plan Note (Signed)
 Has > 20-pack-year smoking history Referred to pulmonology for low-dose CT chest after discussing with the patient.

## 2023-12-27 NOTE — Progress Notes (Signed)
 Established Patient Office Visit  Subjective:  Patient ID: Diana Hubbard, female    DOB: 07-19-1955  Age: 68 y.o. MRN: [id number]  CC:  Chief Complaint  Patient presents with   Annual Exam    Annual exam.     HPI Diana Hubbard is a 68 y.o. female with past medical history of HTN, CKD and HLD who presents for annual physical.  HTN: BP is wnl today. She reports that she has had wnl BP at her workplace as well. Takes medications regularly. Patient denies headache, dizziness, chest pain, dyspnea or palpitations.  HLD: On Lipitor. Lipid profile reviewed.  CKD: She has brought blood test reports from her workplace.  Her last BMP shows GFR of 41, which is worse compared to prior.  She states that she has poor PO intake of fluids. She prefers to increase water  intake for now and if persistent elevated kidney function tests, she will see a Nephrologist. Denies urinary hesitancy, dysuria or hematuria. She has been taking Farxiga  now.  She had has seen ENT specialist for chronic serous left otitis media, but states that her symptoms are better now. She still c/o bilateral ear discomfort with hearing impairment.  She denies any fever or chills.  Denies any ear discharge. Of note, she denies any nasal congestion or postnasal drip currently.     Past Medical History:  Diagnosis Date   Abnormal Pap smear of cervix 01/12/2016   LSIL will get colpo   Age-related osteoporosis without current pathological fracture 12/22/2021   COPD (chronic obstructive pulmonary disease) (HCC)    Hypertension    Mixed hyperlipidemia 06/17/2020   Rectocele 01/06/2016   Screening for lung cancer 08/22/2022   Stage 3a chronic kidney disease (HCC) 06/17/2020   Vaginal Pap smear, abnormal     Past Surgical History:  Procedure Laterality Date   APPENDECTOMY     CATARACT EXTRACTION W/PHACO Left 04/16/2023   Procedure: CATARACT EXTRACTION PHACO AND INTRAOCULAR LENS PLACEMENT (IOC);  Surgeon: Harrie Agent,  MD;  Location: [facility];  Service: Ophthalmology;  Laterality: Left;  Complex case CDE: 32.48   CATARACT EXTRACTION W/PHACO Right 05/14/2023   Procedure: CATARACT EXTRACTION PHACO AND INTRAOCULAR LENS PLACEMENT (IOC);  Surgeon: Harrie Agent, MD;  Location: [facility];  Service: Ophthalmology;  Laterality: Right;  CDE 9.78   CHOLECYSTECTOMY     COLONOSCOPY WITH PROPOFOL  N/A 03/27/2022   Procedure: COLONOSCOPY WITH PROPOFOL ;  Surgeon: Cindie Carlin POUR, DO;  Location: [facility];  Service: Endoscopy;  Laterality: N/A;  11:30 am   POLYPECTOMY  03/27/2022   Procedure: POLYPECTOMY;  Surgeon: Cindie Carlin POUR, DO;  Location: [facility];  Service: Endoscopy;;   TUBAL LIGATION      Family History  Problem Relation Age of Onset   Diabetes Mother    Hypertension Mother    Hyperlipidemia Mother    Other Father        abd aneursym   Hyperlipidemia Father    Hypertension Father    Hemophilia Father    Cancer Maternal Grandmother        uterine    Social History   Socioeconomic History   Marital status: Widowed    Spouse name: Oneil   Number of children: 2   Years of education: 13   Highest education level: Not on file  Occupational History   Occupation: Secretary/administrator: [employer]    Comment: [employer]  Tobacco Use   Smoking status: Every Day  Current packs/day: 0.75    Average packs/day: 0.8 packs/day for 40.0 years (30.0 ttl pk-yrs)    Types: Cigarettes    Passive exposure: Current   Smokeless tobacco: Never  Substance and Sexual Activity   Alcohol use: Yes    Comment: social   Drug use: No   Sexual activity: Yes    Birth control/protection: Post-menopausal, Surgical    Comment: tubal  Other Topics Concern   Not on file  Social History Narrative   Married.  Lives with Husband (patient stated she lives alone, 05/14/23)   Limited caffeine up to 2 -3 cups a day   No reg exercise, walk at work   Social Drivers of Corporate investment banker Strain: Not on file   Food Insecurity: Not on file  Transportation Needs: Not on file  Physical Activity: Not on file  Stress: Not on file  Social Connections: Not on file  Intimate Partner Violence: Not on file    Outpatient Medications Prior to Visit  Medication Sig Dispense Refill   acetaminophen (TYLENOL) 500 MG tablet Take 1,000 mg by mouth 2 (two) times daily.     amLODipine  (NORVASC ) 10 MG tablet TAKE 1 TABLET(10 MG) BY MOUTH DAILY 90 tablet 3   atorvastatin  (LIPITOR) 10 MG tablet TAKE 1 TABLET(10 MG) BY MOUTH DAILY 90 tablet 3   ibandronate  (BONIVA ) 150 MG tablet TAKE 1 TABLET BY MOUTH MONTHLY IN THE MORNING WITH FULL GLASS OF WATER  AND ON AN EMPTY STOMACH. DO NOT LIE DOWN TAKE ANYTHING FOR 30 MINUTES 3 tablet 3   FARXIGA  10 MG TABS tablet TAKE 1 TABLET(10 MG) BY MOUTH DAILY 30 tablet 5   fluticasone  (FLONASE ) 50 MCG/ACT nasal spray Place 2 sprays into both nostrils daily. 16 g 1   losartan -hydrochlorothiazide (HYZAAR) 100-25 MG tablet TAKE 1 TABLET BY MOUTH DAILY 90 tablet 1   FARXIGA  5 MG TABS tablet TAKE 1 TABLET(5 MG) BY MOUTH DAILY 30 tablet 5   No facility-administered medications prior to visit.    Allergies  Allergen Reactions   Ace Inhibitors Cough    cough    ROS Review of Systems  Constitutional:  Negative for chills and fever.  HENT:  Negative for congestion, sinus pressure, sinus pain and sore throat.        B/l hearing problem  Eyes:  Negative for pain and discharge.  Respiratory:  Negative for cough and shortness of breath.   Cardiovascular:  Negative for chest pain and palpitations.  Gastrointestinal:  Negative for abdominal pain, diarrhea, nausea and vomiting.  Endocrine: Negative for polydipsia and polyuria.  Genitourinary:  Negative for dysuria and hematuria.  Musculoskeletal:  Negative for neck pain and neck stiffness.  Skin:  Negative for rash.  Neurological:  Negative for dizziness, seizures, syncope and weakness.  Psychiatric/Behavioral:  Negative for agitation  and behavioral problems.       Objective:    Physical Exam Vitals reviewed.  Constitutional:      General: She is not in acute distress.    Appearance: She is not diaphoretic.  HENT:     Head: Normocephalic and atraumatic.     Left Ear: A middle ear effusion is present.     Nose: Nose normal.     Mouth/Throat:     Mouth: Mucous membranes are moist.   Eyes:     General: No scleral icterus.    Extraocular Movements: Extraocular movements intact.   Neck:     Vascular: No carotid bruit.   Cardiovascular:  Rate and Rhythm: Normal rate and regular rhythm.     Heart sounds: Normal heart sounds. No murmur heard. Pulmonary:     Breath sounds: Normal breath sounds. No wheezing or rales.  Abdominal:     Palpations: Abdomen is soft.     Tenderness: There is no abdominal tenderness.   Musculoskeletal:     Cervical back: Neck supple. No tenderness.     Right lower leg: No edema.     Left lower leg: No edema.   Skin:    General: Skin is warm.     Findings: No rash.   Neurological:     General: No focal deficit present.     Mental Status: She is alert and oriented to person, place, and time.     Cranial Nerves: No cranial nerve deficit.     Sensory: No sensory deficit.     Motor: No weakness.   Psychiatric:        Mood and Affect: Mood normal.        Behavior: Behavior normal.     BP 128/80 (BP Location: Left Arm)   Pulse (!) 112   Ht 5' 8 (1.727 m)   Wt 152 lb 6.4 oz (69.1 kg)   SpO2 92%   BMI 23.17 kg/m  Wt Readings from Last 3 Encounters:  12/27/23 152 lb 6.4 oz (69.1 kg)  05/14/23 148 lb (67.1 kg)  04/16/23 149 lb 11.1 oz (67.9 kg)    Lab Results  Component Value Date   TSH 2.35 12/18/2018   Lab Results  Component Value Date   WBC 11.9 12/18/2018   HGB 13.9 03/27/2022   HCT 41.0 03/27/2022   MCV 100.8 (H) 03/08/2017   PLT 272 03/08/2017   Lab Results  Component Value Date   NA 142 03/27/2022   K 3.6 03/27/2022   CO2 26 03/23/2022   GLUCOSE  103 (H) 03/27/2022   BUN 13 03/27/2022   CREATININE 1.30 (H) 03/27/2022   BILITOT 0.6 03/08/2017   AST 23 03/08/2017   ALT 17 03/08/2017   PROT 7.3 03/08/2017   CALCIUM  8.8 (L) 03/23/2022   ANIONGAP 8 03/23/2022   EGFR 50.0 01/16/2023   Lab Results  Component Value Date   CHOL 118 11/09/2021   Lab Results  Component Value Date   HDL 49 11/09/2021   Lab Results  Component Value Date   LDLCALC 42 11/09/2021   Lab Results  Component Value Date   TRIG 162 (A) 11/09/2021   Lab Results  Component Value Date   CHOLHDL 3.3 03/08/2017   Lab Results  Component Value Date   HGBA1C 5.2 12/18/2018      Assessment & Plan:   Problem List Items Addressed This Visit       Cardiovascular and Mediastinum   Essential hypertension   BP Readings from Last 1 Encounters:  12/27/23 128/80   Well-controlled with Losartan -HCTZ 100-25 mg QD and Amlodipine  10 mg QD Counseled for compliance with the medications Advised DASH diet and moderate exercise/walking, at least 150 mins/week      Relevant Medications   losartan -hydrochlorothiazide (HYZAAR) 100-25 MG tablet     Respiratory   Centrilobular emphysema (HCC)   Noticed on low dose CT chest done for lung cancer screening No active complaints currently Counseled to quit smoking, still smokes about 0.5 pack/day      Relevant Medications   fluticasone  (FLONASE ) 50 MCG/ACT nasal spray   Allergic rhinitis due to pollen   Advised to use Flonase ,  can also help with postnasal drip related sore throat      Relevant Medications   fluticasone  (FLONASE ) 50 MCG/ACT nasal spray     Endocrine   Hyperparathyroidism, secondary (HCC)   Likely due to CKD PTH: 85 -> 78 -> 31 now Needs to continue Vitamin D supplement, Ca wnl If persistently elevated PTH, can add calcitriol        Musculoskeletal and Integument   Age-related osteoporosis without current pathological fracture   Had headache with alendronate  (?) On Ibandronate  now,  tolerates it well Continue vitamin D supplement        Genitourinary   Chronic kidney disease, stage 3b (HCC)   Last BMP reviewed, stable GFR at 41, had improved to 50 in the last blood tests Advised to increase water  intake Will check BMP after 4 months, if persistent elevated Cr. with low GFR, will refer to Nephrology - she has denied referral for now On Farxiga  10 mg daily Checked urine albumin/creatinine ratio - slightly elevated, on ARB and Farxiga       Relevant Medications   dapagliflozin  propanediol (FARXIGA ) 10 MG TABS tablet   Other Relevant Orders   Basic Metabolic Panel (BMET)   CBC with Differential/Platelet   Urine Microalbumin w/creat. ratio     Other   Tobacco abuse   Smokes about 0.5 pack/day  Asked about quitting: confirms that he/she currently smokes cigarettes Advise to quit smoking: Educated about QUITTING to reduce the risk of cancer, cardio and cerebrovascular disease. Assess willingness: Unwilling to quit at this time, but is working on cutting back. Assist with counseling and pharmacotherapy: Counseled for 5 minutes and literature provided. Arrange for follow up: follow up in 3 months and continue to offer help.      Encounter for general adult medical examination with abnormal findings - Primary   Physical exam as documented. Blood tests reviewed and discussed with the patient in detail. Has had Pneumococcal, Shingrix  and flu vaccines.      Screening for lung cancer   Has > 20-pack-year smoking history Referred to pulmonology for low-dose CT chest after discussing with the patient.       Relevant Orders   Ambulatory Referral Lung Cancer Screening [facility]   Other Visit Diagnoses       Breast cancer screening by mammogram       Relevant Orders   MM 3D SCREENING MAMMOGRAM BILATERAL BREAST       Meds ordered this encounter  Medications   fluticasone  (FLONASE ) 50 MCG/ACT nasal spray    Sig: Place 2 sprays into both nostrils  daily.    Dispense:  16 g    Refill:  1   losartan -hydrochlorothiazide (HYZAAR) 100-25 MG tablet    Sig: Take 1 tablet by mouth daily.    Dispense:  90 tablet    Refill:  1   dapagliflozin  propanediol (FARXIGA ) 10 MG TABS tablet    Sig: Take 1 tablet (10 mg total) by mouth daily.    Dispense:  30 tablet    Refill:  5    Follow-up: Return in about 4 months (around 04/27/2024) for HTN and CKD.    Suzzane MARLA Blanch, MD

## 2023-12-27 NOTE — Assessment & Plan Note (Addendum)
 Advised to use Flonase , can also help with postnasal drip related sore throat

## 2023-12-27 NOTE — Assessment & Plan Note (Signed)
Smokes about 0.5 pack/day  Asked about quitting: confirms that he/she currently smokes cigarettes Advise to quit smoking: Educated about QUITTING to reduce the risk of cancer, cardio and cerebrovascular disease. Assess willingness: Unwilling to quit at this time, but is working on cutting back. Assist with counseling and pharmacotherapy: Counseled for 5 minutes and literature provided. Arrange for follow up: follow up in 3 months and continue to offer help. 

## 2023-12-27 NOTE — Assessment & Plan Note (Signed)
Physical exam as documented. Blood tests reviewed and discussed with the patient in detail. Has had Pneumococcal, Shingrix and flu vaccines.

## 2023-12-27 NOTE — Assessment & Plan Note (Signed)
 Last BMP reviewed, stable GFR at 41, had improved to 50 in the last blood tests Advised to increase water  intake Will check BMP after 4 months, if persistent elevated Cr. with low GFR, will refer to Nephrology - she has denied referral for now On Farxiga  10 mg daily Checked urine albumin/creatinine ratio - slightly elevated, on ARB and Farxiga 

## 2023-12-27 NOTE — Assessment & Plan Note (Signed)
 BP Readings from Last 1 Encounters:  12/27/23 128/80   Well-controlled with Losartan -HCTZ 100-25 mg QD and Amlodipine  10 mg QD Counseled for compliance with the medications Advised DASH diet and moderate exercise/walking, at least 150 mins/week

## 2023-12-27 NOTE — Assessment & Plan Note (Signed)
 Noticed on low dose CT chest done for lung cancer screening No active complaints currently Counseled to quit smoking, still smokes about 0.5 pack/day

## 2024-01-07 ENCOUNTER — Ambulatory Visit (HOSPITAL_COMMUNITY)

## 2024-01-09 LAB — CBC WITH DIFFERENTIAL/PLATELET
A1c: 5.2
EGFR: 41

## 2024-02-19 ENCOUNTER — Other Ambulatory Visit: Payer: Self-pay | Admitting: Internal Medicine

## 2024-02-19 DIAGNOSIS — N1832 Chronic kidney disease, stage 3b: Secondary | ICD-10-CM

## 2024-03-06 ENCOUNTER — Other Ambulatory Visit: Payer: Self-pay | Admitting: Internal Medicine

## 2024-03-06 DIAGNOSIS — J301 Allergic rhinitis due to pollen: Secondary | ICD-10-CM

## 2024-04-23 LAB — LAB REPORT - SCANNED
Albumin, Urine POC: 23.9
Creatinine, POC: 61.3 mg/dL
EGFR: 50
Microalb Creat Ratio: 39

## 2024-04-28 ENCOUNTER — Ambulatory Visit: Admitting: Internal Medicine

## 2024-04-28 ENCOUNTER — Encounter: Payer: Self-pay | Admitting: Internal Medicine

## 2024-04-28 VITALS — BP 132/84 | HR 103 | Ht 68.0 in | Wt 151.6 lb

## 2024-04-28 DIAGNOSIS — M810 Age-related osteoporosis without current pathological fracture: Secondary | ICD-10-CM

## 2024-04-28 DIAGNOSIS — Z720 Tobacco use: Secondary | ICD-10-CM

## 2024-04-28 DIAGNOSIS — I700 Atherosclerosis of aorta: Secondary | ICD-10-CM

## 2024-04-28 DIAGNOSIS — I10 Essential (primary) hypertension: Secondary | ICD-10-CM

## 2024-04-28 DIAGNOSIS — N1832 Chronic kidney disease, stage 3b: Secondary | ICD-10-CM | POA: Diagnosis not present

## 2024-04-28 DIAGNOSIS — E782 Mixed hyperlipidemia: Secondary | ICD-10-CM

## 2024-04-28 DIAGNOSIS — N1831 Chronic kidney disease, stage 3a: Secondary | ICD-10-CM

## 2024-04-28 NOTE — Assessment & Plan Note (Signed)
Lipid profile reviewed On Lipitor 10 mg daily

## 2024-04-28 NOTE — Assessment & Plan Note (Addendum)
 Last lipid profile reviewed Repeat lipid profile prior to the next visit On Lipitor 10 mg QD

## 2024-04-28 NOTE — Assessment & Plan Note (Addendum)
 BP Readings from Last 1 Encounters:  04/28/24 132/84   Usually well-controlled with Losartan -HCTZ 100-25 mg QD and Amlodipine  10 mg QD Counseled for compliance with the medications Advised DASH diet and moderate exercise/walking, at least 150 mins/week

## 2024-04-28 NOTE — Assessment & Plan Note (Signed)
Smokes about 0.5 pack/day  Asked about quitting: confirms that he/she currently smokes cigarettes Advise to quit smoking: Educated about QUITTING to reduce the risk of cancer, cardio and cerebrovascular disease. Assess willingness: Unwilling to quit at this time, but is working on cutting back. Assist with counseling and pharmacotherapy: Counseled for 5 minutes and literature provided. Arrange for follow up: follow up in 3 months and continue to offer help. 

## 2024-04-28 NOTE — Assessment & Plan Note (Addendum)
 Last BMP reviewed, stable GFR at 50 Advised to increase water  intake Will check BMP after 4 months, if elevated Cr. with worsening GFR, will refer to Nephrology - she has denied referral for now On Farxiga  10 mg daily Checked urine albumin/creatinine ratio - slightly elevated, but better compared to prior, on ARB and Farxiga 

## 2024-04-28 NOTE — Patient Instructions (Signed)
 Please continue to take medications as prescribed.  Please continue to follow low carb diet and ambulate as tolerated.  Please start taking Vitamin B12 - 500 mcg once daily.

## 2024-04-28 NOTE — Progress Notes (Signed)
 Established Patient Office Visit  Subjective:  Patient ID: Diana Hubbard, female    DOB: 28-Mar-1956  Age: 68 y.o. MRN: [id number]  CC:  Chief Complaint  Patient presents with   Hypertension    4 Month f/u    Chronic Kidney Disease    4 month f/u     HPI Diana Hubbard is a 68 y.o. female with past medical history of HTN, CKD and HLD who presents for annual physical.  HTN: BP is elevated today. She reports that she has had wnl BP at her workplace as well. Takes medications regularly. Patient denies headache, dizziness, chest pain, dyspnea or palpitations.  HLD: On Lipitor. Lipid profile reviewed.  CKD: She has brought blood test reports from her workplace.  Her last BMP shows GFR of 50, which is better compared to prior.  She states that she has poor PO intake of fluids. She prefers to increase water  intake for now and if worsening kidney function tests, she will see a Nephrologist. Denies urinary hesitancy, dysuria or hematuria. She has been taking Farxiga  now.  She has seen ENT specialist for chronic serous left otitis media, but states that her symptoms are better now. 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  She denies any fever or chills.  Denies any ear discharge. Of note, she denies any nasal congestion or postnasal drip currently.     Past Medical History:  Diagnosis Date   Abnormal Pap smear of cervix 01/12/2016   LSIL will get colpo   Age-related osteoporosis without current pathological fracture 12/22/2021   COPD (chronic obstructive pulmonary disease) (HCC)    Hypertension    Mixed hyperlipidemia 06/17/2020   Rectocele 01/06/2016   Screening for lung cancer 08/22/2022   Stage 3a chronic kidney disease (HCC) 06/17/2020   Vaginal Pap smear, abnormal     Past Surgical History:  Procedure Laterality Date   APPENDECTOMY     CATARACT EXTRACTION W/PHACO Left 04/16/2023   Procedure: CATARACT EXTRACTION PHACO AND INTRAOCULAR LENS PLACEMENT (IOC);  Surgeon:  Harrie Agent, MD;  Location: [facility];  Service: Ophthalmology;  Laterality: Left;  Complex case CDE: 32.48   CATARACT EXTRACTION W/PHACO Right 05/14/2023   Procedure: CATARACT EXTRACTION PHACO AND INTRAOCULAR LENS PLACEMENT (IOC);  Surgeon: Harrie Agent, MD;  Location: [facility];  Service: Ophthalmology;  Laterality: Right;  CDE 9.78   CHOLECYSTECTOMY     COLONOSCOPY WITH PROPOFOL  N/A 03/27/2022   Procedure: COLONOSCOPY WITH PROPOFOL ;  Surgeon: Cindie Carlin POUR, DO;  Location: [facility];  Service: Endoscopy;  Laterality: N/A;  11:30 am   POLYPECTOMY  03/27/2022   Procedure: POLYPECTOMY;  Surgeon: Cindie Carlin POUR, DO;  Location: [facility];  Service: Endoscopy;;   TUBAL LIGATION      Family History  Problem Relation Age of Onset   Diabetes Mother    Hypertension Mother    Hyperlipidemia Mother    Other Father        abd aneursym   Hyperlipidemia Father    Hypertension Father    Hemophilia Father    Cancer Maternal Grandmother        uterine    Social History   Socioeconomic History   Marital status: Widowed    Spouse name: Oneil   Number of children: 2   Years of education: 13   Highest education level: Not on file  Occupational History   Occupation: Secretary/administrator: [employer]    Comment: [employer]  Tobacco Use  Smoking status: Every Day    Current packs/day: 0.75    Average packs/day: 0.8 packs/day for 40.0 years (30.0 ttl pk-yrs)    Types: Cigarettes    Passive exposure: Current   Smokeless tobacco: Never  Substance and Sexual Activity   Alcohol use: Yes    Comment: social   Drug use: No   Sexual activity: Yes    Birth control/protection: Post-menopausal, Surgical    Comment: tubal  Other Topics Concern   Not on file  Social History Narrative   Married.  Lives with Husband (patient stated she lives alone, 05/14/23)   Limited caffeine up to 2 -3 cups a day   No reg exercise, walk at work   Social Drivers of Corporate Investment [employer]  Strain: Not on file  Food Insecurity: Not on file  Transportation Needs: Not on file  Physical Activity: Not on file  Stress: Not on file  Social Connections: Not on file  Intimate Partner Violence: Not on file    Outpatient Medications Prior to Visit  Medication Sig Dispense Refill   acetaminophen (TYLENOL) 500 MG tablet Take 1,000 mg by mouth 2 (two) times daily.     amLODipine  (NORVASC ) 10 MG tablet TAKE 1 TABLET(10 MG) BY MOUTH DAILY 90 tablet 3   atorvastatin  (LIPITOR) 10 MG tablet TAKE 1 TABLET(10 MG) BY MOUTH DAILY 90 tablet 3   FARXIGA  10 MG TABS tablet TAKE 1 TABLET(10 MG) BY MOUTH DAILY 30 tablet 5   fluticasone  (FLONASE ) 50 MCG/ACT nasal spray SHAKE LIQUID AND USE 2 SPRAYS IN EACH NOSTRIL DAILY 48 g 0   ibandronate  (BONIVA ) 150 MG tablet TAKE 1 TABLET BY MOUTH MONTHLY IN THE MORNING WITH FULL GLASS OF WATER  AND ON AN EMPTY STOMACH. DO NOT LIE DOWN TAKE ANYTHING FOR 30 MINUTES 3 tablet 3   losartan -hydrochlorothiazide (HYZAAR) 100-25 MG tablet Take 1 tablet by mouth daily. 90 tablet 1   No facility-administered medications prior to visit.    Allergies  Allergen Reactions   Ace Inhibitors Cough    cough    ROS Review of Systems  Constitutional:  Negative for chills and fever.  HENT:  Negative for congestion, sinus pressure, sinus pain and sore throat.        B/l hearing problem  Eyes:  Negative for pain and discharge.  Respiratory:  Negative for cough and shortness of breath.   Cardiovascular:  Negative for chest pain and palpitations.  Gastrointestinal:  Negative for abdominal pain, diarrhea, nausea and vomiting.  Endocrine: Negative for polydipsia and polyuria.  Genitourinary:  Negative for dysuria and hematuria.  Musculoskeletal:  Negative for neck pain and neck stiffness.  Skin:  Negative for rash.  Neurological:  Negative for dizziness, seizures, syncope and weakness.  Psychiatric/Behavioral:  Negative for agitation and behavioral problems. The patient is  nervous/anxious.       Objective:    Physical Exam Vitals reviewed.  Constitutional:      General: She is not in acute distress.    Appearance: She is not diaphoretic.  HENT:     Head: Normocephalic and atraumatic.     Nose: Nose normal.     Mouth/Throat:     Mouth: Mucous membranes are moist.  Eyes:     General: No scleral icterus.    Extraocular Movements: Extraocular movements intact.  Neck:     Vascular: No carotid bruit.  Cardiovascular:     Rate and Rhythm: Normal rate and regular rhythm.     Heart sounds:  Normal heart sounds. No murmur heard. Pulmonary:     Breath sounds: Normal breath sounds. No wheezing or rales.  Musculoskeletal:     Cervical back: Neck supple. No tenderness.     Right lower leg: No edema.     Left lower leg: No edema.  Skin:    General: Skin is warm.     Findings: No rash.  Neurological:     General: No focal deficit present.     Mental Status: She is alert and oriented to person, place, and time.     Sensory: No sensory deficit.     Motor: No weakness.  Psychiatric:        Mood and Affect: Mood normal.        Behavior: Behavior normal.     BP 132/84 (BP Location: Left Arm) Comment: At home  Pulse (!) 103   Ht 5' 8 (1.727 m)   Wt 151 lb 9.6 oz (68.8 kg)   SpO2 97%   BMI 23.05 kg/m  Wt Readings from Last 3 Encounters:  04/28/24 151 lb 9.6 oz (68.8 kg)  12/27/23 152 lb 6.4 oz (69.1 kg)  05/14/23 148 lb (67.1 kg)    Lab Results  Component Value Date   TSH 2.35 12/18/2018   Lab Results  Component Value Date   WBC 11.9 12/18/2018   HGB 13.9 03/27/2022   HCT 41.0 03/27/2022   MCV 100.8 (H) 03/08/2017   PLT 272 03/08/2017   Lab Results  Component Value Date   NA 142 03/27/2022   K 3.6 03/27/2022   CO2 26 03/23/2022   GLUCOSE 103 (H) 03/27/2022   BUN 13 03/27/2022   CREATININE 1.30 (H) 03/27/2022   BILITOT 0.6 03/08/2017   AST 23 03/08/2017   ALT 17 03/08/2017   PROT 7.3 03/08/2017   CALCIUM  8.8 (L) 03/23/2022    ANIONGAP 8 03/23/2022   EGFR 41.0 12/19/2023   Lab Results  Component Value Date   CHOL 118 11/09/2021   Lab Results  Component Value Date   HDL 49 11/09/2021   Lab Results  Component Value Date   LDLCALC 42 11/09/2021   Lab Results  Component Value Date   TRIG 162 (A) 11/09/2021   Lab Results  Component Value Date   CHOLHDL 3.3 03/08/2017   Lab Results  Component Value Date   HGBA1C 5.2 12/18/2018      Assessment & Plan:   Problem List Items Addressed This Visit       Cardiovascular and Mediastinum   Essential hypertension - Primary   BP Readings from Last 1 Encounters:  04/28/24 132/84   Usually well-controlled with Losartan -HCTZ 100-25 mg QD and Amlodipine  10 mg QD Counseled for compliance with the medications Advised DASH diet and moderate exercise/walking, at least 150 mins/week      Aortic atherosclerosis   Lipid profile reviewed On Lipitor 10 mg daily        Musculoskeletal and Integument   Age-related osteoporosis without current pathological fracture   Had headache with alendronate  (?) On Ibandronate  now, tolerates it well Continue vitamin D supplement        Genitourinary   Chronic kidney disease, stage 3a (HCC)   Last BMP reviewed, stable GFR at 50 Advised to increase water  intake Will check BMP after 4 months, if elevated Cr. with worsening GFR, will refer to Nephrology - she has denied referral for now On Farxiga  10 mg daily Checked urine albumin/creatinine ratio - slightly elevated, but better compared to  prior, on ARB and Farxiga         Other   Tobacco abuse   Smokes about 0.5 pack/day  Asked about quitting: confirms that he/she currently smokes cigarettes Advise to quit smoking: Educated about QUITTING to reduce the risk of cancer, cardio and cerebrovascular disease. Assess willingness: Unwilling to quit at this time, but is working on cutting back. Assist with counseling and pharmacotherapy: Counseled for 5 minutes and  literature provided. Arrange for follow up: follow up in 3 months and continue to offer help.      Mixed hyperlipidemia   Last lipid profile reviewed Repeat lipid profile prior to the next visit On Lipitor 10 mg QD      Relevant Orders   Lipid Profile     No orders of the defined types were placed in this encounter.   Follow-up: Return in about 5 months (around 09/26/2024) for HTN and CKD.    Suzzane MARLA Blanch, MD

## 2024-04-28 NOTE — Assessment & Plan Note (Signed)
Had headache with alendronate (?) On Ibandronate now, tolerates it well Continue vitamin D supplement

## 2024-08-04 ENCOUNTER — Other Ambulatory Visit: Payer: Self-pay | Admitting: Internal Medicine

## 2024-08-04 DIAGNOSIS — I10 Essential (primary) hypertension: Secondary | ICD-10-CM

## 2024-09-25 ENCOUNTER — Ambulatory Visit: Admitting: Internal Medicine
